# Patient Record
Sex: Male | Born: 1975 | Race: White | Hispanic: No | Smoking: Current every day smoker
Health system: Southern US, Community
[De-identification: ages and names within clinical notes are randomized; demographics above are authoritative.]

## PROBLEM LIST (undated history)

## (undated) DIAGNOSIS — R112 Nausea with vomiting, unspecified: Secondary | ICD-10-CM

## (undated) DIAGNOSIS — K219 Gastro-esophageal reflux disease without esophagitis: Secondary | ICD-10-CM

## (undated) DIAGNOSIS — F419 Anxiety disorder, unspecified: Secondary | ICD-10-CM

## (undated) DIAGNOSIS — I639 Cerebral infarction, unspecified: Secondary | ICD-10-CM

## (undated) DIAGNOSIS — R0989 Other specified symptoms and signs involving the circulatory and respiratory systems: Secondary | ICD-10-CM

## (undated) DIAGNOSIS — M1990 Unspecified osteoarthritis, unspecified site: Secondary | ICD-10-CM

## (undated) DIAGNOSIS — R519 Headache, unspecified: Secondary | ICD-10-CM

## (undated) DIAGNOSIS — I499 Cardiac arrhythmia, unspecified: Secondary | ICD-10-CM

## (undated) DIAGNOSIS — H3320 Serous retinal detachment, unspecified eye: Secondary | ICD-10-CM

## (undated) DIAGNOSIS — G4730 Sleep apnea, unspecified: Secondary | ICD-10-CM

## (undated) DIAGNOSIS — I10 Essential (primary) hypertension: Secondary | ICD-10-CM

## (undated) DIAGNOSIS — Z9889 Other specified postprocedural states: Secondary | ICD-10-CM

## (undated) DIAGNOSIS — R51 Headache: Secondary | ICD-10-CM

## (undated) HISTORY — PX: VASECTOMY: SHX75

## (undated) HISTORY — PX: MOUTH SURGERY: SHX715

## (undated) HISTORY — PX: KNEE SURGERY: SHX244

## (undated) HISTORY — PX: CARPAL TUNNEL RELEASE: SHX101

---

## 1999-04-11 ENCOUNTER — Emergency Department (HOSPITAL_COMMUNITY): Admission: EM | Admit: 1999-04-11 | Discharge: 1999-04-11 | Payer: Self-pay | Admitting: Emergency Medicine

## 2005-11-01 ENCOUNTER — Emergency Department (HOSPITAL_COMMUNITY): Admission: EM | Admit: 2005-11-01 | Discharge: 2005-11-01 | Payer: Self-pay | Admitting: Emergency Medicine

## 2007-05-28 ENCOUNTER — Ambulatory Visit (HOSPITAL_COMMUNITY): Admission: RE | Admit: 2007-05-28 | Discharge: 2007-05-28 | Payer: Self-pay | Admitting: Orthopedic Surgery

## 2008-07-18 ENCOUNTER — Inpatient Hospital Stay (HOSPITAL_COMMUNITY): Admission: EM | Admit: 2008-07-18 | Discharge: 2008-07-20 | Payer: Self-pay | Admitting: Emergency Medicine

## 2008-07-28 DIAGNOSIS — F17200 Nicotine dependence, unspecified, uncomplicated: Secondary | ICD-10-CM | POA: Insufficient documentation

## 2008-07-28 DIAGNOSIS — R079 Chest pain, unspecified: Secondary | ICD-10-CM | POA: Insufficient documentation

## 2008-07-28 DIAGNOSIS — R42 Dizziness and giddiness: Secondary | ICD-10-CM | POA: Insufficient documentation

## 2008-07-28 DIAGNOSIS — I10 Essential (primary) hypertension: Secondary | ICD-10-CM | POA: Insufficient documentation

## 2008-07-29 ENCOUNTER — Ambulatory Visit: Payer: Self-pay | Admitting: Cardiology

## 2008-07-29 DIAGNOSIS — R55 Syncope and collapse: Secondary | ICD-10-CM | POA: Insufficient documentation

## 2008-08-03 ENCOUNTER — Telehealth (INDEPENDENT_AMBULATORY_CARE_PROVIDER_SITE_OTHER): Payer: Self-pay | Admitting: *Deleted

## 2008-08-04 ENCOUNTER — Ambulatory Visit: Payer: Self-pay | Admitting: Cardiology

## 2008-08-04 ENCOUNTER — Encounter: Payer: Self-pay | Admitting: Cardiology

## 2008-08-04 ENCOUNTER — Ambulatory Visit: Payer: Self-pay

## 2008-08-18 LAB — CONVERTED CEMR LAB
Cholesterol: 174 mg/dL (ref 0–200)
LDL Cholesterol: 120 mg/dL — ABNORMAL HIGH (ref 0–99)

## 2008-08-22 ENCOUNTER — Ambulatory Visit: Payer: Self-pay

## 2008-08-22 ENCOUNTER — Encounter: Payer: Self-pay | Admitting: Cardiology

## 2008-08-26 ENCOUNTER — Encounter: Payer: Self-pay | Admitting: Cardiology

## 2008-08-29 ENCOUNTER — Ambulatory Visit: Payer: Self-pay | Admitting: Cardiology

## 2009-01-10 ENCOUNTER — Telehealth (INDEPENDENT_AMBULATORY_CARE_PROVIDER_SITE_OTHER): Payer: Self-pay | Admitting: *Deleted

## 2009-05-26 ENCOUNTER — Telehealth (INDEPENDENT_AMBULATORY_CARE_PROVIDER_SITE_OTHER): Payer: Self-pay | Admitting: *Deleted

## 2009-10-06 ENCOUNTER — Encounter: Payer: Self-pay | Admitting: Cardiology

## 2009-10-06 ENCOUNTER — Emergency Department (HOSPITAL_COMMUNITY): Admission: EM | Admit: 2009-10-06 | Discharge: 2009-10-07 | Payer: Self-pay | Admitting: Emergency Medicine

## 2009-10-11 ENCOUNTER — Telehealth: Payer: Self-pay | Admitting: Cardiology

## 2009-10-18 ENCOUNTER — Ambulatory Visit: Payer: Self-pay | Admitting: Cardiology

## 2009-11-24 ENCOUNTER — Ambulatory Visit: Payer: Self-pay

## 2009-11-24 ENCOUNTER — Ambulatory Visit: Payer: Self-pay | Admitting: Cardiology

## 2009-12-31 ENCOUNTER — Emergency Department (HOSPITAL_BASED_OUTPATIENT_CLINIC_OR_DEPARTMENT_OTHER): Admission: EM | Admit: 2009-12-31 | Discharge: 2009-12-31 | Payer: Self-pay | Admitting: Emergency Medicine

## 2009-12-31 ENCOUNTER — Ambulatory Visit: Payer: Self-pay | Admitting: Diagnostic Radiology

## 2010-04-05 NOTE — Progress Notes (Signed)
  Recieved Request for Records from [facility] forwarded to [facility] for processing [facility]  May 26, 2009 8:31 AM    Appended Document:  2nd request recieved from [facility] forwarded to [facility]   Appended Document:  Recieved request from [facility] to cancel the copying of Records forwarded to [facility]   Appended Document:  Recieved request from [facility]   Appended Document:  Received  [facility] documentation from [place], [facility], company is cancelling their request for records.

## 2010-04-05 NOTE — Assessment & Plan Note (Signed)
Summary: rov per pt mother call/pt seen in ER w/ SVT/lg   Visit Type:  rov Primary Provider:  Dr. Rosezetta Schlatter, [facility]  CC:  chest pain off and on states pt....sob at times...edema/hands....  History of Present Illness: 35 yo with history of HTN and smoking returns for cardiology evaluation because of recent chest pain. He was seen 2010 because of syncopal event that after workup may have been a seizure in the setting of Chantix use.  I also did an ETT-myoview in 2010 given his episodes of chest pain and family history of premature CAD.  This showed no evidence for ischemia or infarction.  EF was read as low on myoview but visually looked normal.  Therefore, we did an echocardiogram also that confirmed normal LV systolic function and no significant valvular abnormalities.    He has had 3 episodes of recent chest pain.  Two occurred at work and lasted for a few minutes.  The third occurred while he was sitting in his truck at the [facility] drive through.  He developed chest tightness, nausea, and lightheadedness.  He felt profoundly weak.  He did not pass out.  He went to the ER for evaluation where CXR and cardiac enzymes were normal as well as ECG.  He was sent home.  The pain was similar in character to the past, but the [facility] episode was more severe.  He has had no further syncopal episodes since 5/10.    ECG: NSR, normal  Labs (8/11): creatinine 0.86, CEs normal, HCT 39.9    Current Medications (verified): 1)  Metoprolol Tartrate 25 Mg Tabs (Metoprolol Tartrate) .... 1/2 Tab Two Times A Day 2)  Adult Aspirin Ec Low Strength 81 Mg Tbec (Aspirin) .... Once Daily  Allergies (verified): No Known Drug Allergies  Past History:  Past Medical History: 1.  Hypertension 2.  Syncopal episode 5/10: Possible seizure 3.  Smoker  4.  Echocardiogram (6/10): EF 55-60%, no significant valvular abnormalities.  5.  ETT-myoview (6/10): pt exercised 10'15".  He had some chest tightness.   EF 48%. Normal wall motion. No ischemia or scar.  6.  Holter (6/10): one run of 5 beats SVT, otherwise no significant findings 7.  Smoker  Family History: Reviewed history from 07/29/2008 and no changes required. Father: HTN.  Mother: "irregular heart beat".  Grandmother: CHF, died 57.  Both grandfathers with MIs around the age of 83.   Social History: Reviewed history from 07/29/2008 and no changes required. Full Time-mechanic Married  Tobacco Use - Yes. 1.5 ppd prior, now down to 0.5 ppd.   Review of Systems       All systems reviewed and negative except as per HPI.   Vital Signs:  Patient profile:   35 year old male Height:      70 inches Weight:      220 pounds BMI:     31.68 Pulse rate:   78 / minute Pulse rhythm:   regular BP sitting:   112 / 80  (left arm) Cuff size:   large  Vitals Entered By: Danielle Rankin, CMA (October 18, 2009 3:11 PM)  Physical Exam  General:  Well developed, well nourished, in no acute distress. Neck:  Neck supple, no JVD. No masses, thyromegaly or abnormal cervical nodes. Lungs:  Clear bilaterally to auscultation and percussion. Heart:  Non-displaced PMI, chest non-tender; regular rate and rhythm, S1, S2 without murmurs, rubs or gallops. Carotid upstroke normal, no bruit. Pedals normal pulses. No edema, no varicosities. Abdomen:  Bowel sounds positive; abdomen soft and non-tender without masses, organomegaly, or hernias noted. No hepatosplenomegaly. Extremities:  No clubbing or cyanosis. Neurologic:  Alert and oriented x 3. Psych:  Normal affect.   Impression & Recommendations:  Problem # 1:  CHEST PAIN (ICD-786.50) Patient has had several recent episodes of severe atypical CP.  ECG and workup in the ER were unremarkable.  Risk factors are smoking and family history of CAD.  He carries a diagnosis of HTN but BP is ok off meds.  I will evaluate him with an ETT.   Problem # 2:  TOBACCO ABUSE (ICD-305.1) I advised him strongly to quit.    Other Orders: Treadmill (Treadmill)  Patient Instructions: 1)  Your physician has requested that you have an exercise tolerance test.  For further information please visit [email or web address]  Please also follow instruction sheet, as given.

## 2010-04-05 NOTE — Letter (Signed)
Summary: [facility] Physician Documentation Sheet   [facility] Physician Documentation Sheet   Imported By: Roderic Ovens 11/02/2009 14:39:54  _____________________________________________________________________  External Attachment:    Type:   Image     Comment:   External Document

## 2010-04-05 NOTE — Progress Notes (Signed)
Summary: chest pain,sob,numbness...x 2 days [facility])  Phone Note Call from Patient   Caller: Mom Reason for Call: Talk to Nurse Summary of Call: pt's mom called to say pt has been coming home from work last couple days complaining of chest pain, also has sob,numbness hands/arms,nausea,breaks out in a sweat-has appt 8-17 for hospital fu for the same symptoms pls advise [phone] Initial call taken by: Glynda Jaeger,  October 11, 2009 11:38 AM  Follow-up for Phone Call        [facility]  Scherrie Bateman, LPN  October 11, 2009 12:14 PM [facility]. Sherri Rad, RN, BSN  October 12, 2009 9:12 AM   per pt calling back [phone] Lorne Skeens  October 12, 2009 10:30 AM   Additional Follow-up for Phone Call Additional follow up Details #1::        SPOKE WITH PT HAS APPT WITH DR [facility] SCHEDULED NEXT WEEK.INSTRUCTED TO KEEP APPT AND TO LIMIT ACTIVITY AT THIS TIME INSTRUCTED IF S/S WORSEN TO GO TO ER.VERBALIZED UNDERSTANDING. Additional Follow-up by: Scherrie Bateman, LPN,  October 12, 2009 3:58 PM    Prescriptions: METOPROLOL TARTRATE 25 MG TABS (METOPROLOL TARTRATE) 1/2 tab two times a day  #30 x 3   Entered by:   Scherrie Bateman, LPN   Authorized by:   Marca Ancona, MD   Signed by:   Scherrie Bateman, LPN on 45/40/9811   Method used:   Electronically to        [facility] [address] (retail)       [address]       [place], [place]  [address]       Ph: [phone] or [phone]       Fax: [phone]   RxID:   [id number]

## 2010-05-16 LAB — CBC
HCT: 41.8 % (ref 39.0–52.0)
Hemoglobin: 14.7 g/dL (ref 13.0–17.0)
MCH: 31.2 pg (ref 26.0–34.0)
RBC: 4.71 MIL/uL (ref 4.22–5.81)

## 2010-05-16 LAB — POCT CARDIAC MARKERS
Myoglobin, poc: 47 ng/mL (ref 12–200)
Myoglobin, poc: 51.6 ng/mL (ref 12–200)
Troponin i, poc: <0.05 ng/mL (ref 0.00–0.09)

## 2010-05-16 LAB — BASIC METABOLIC PANEL
Calcium: 9.4 mg/dL (ref 8.4–10.5)
Chloride: 109 mEq/L (ref 96–112)
Creatinine, Ser: 0.8 mg/dL (ref 0.4–1.5)
GFR calc Af Amer: >60 mL/min (ref >60)

## 2010-05-18 LAB — POCT CARDIAC MARKERS
CKMB, poc: 2.1 ng/mL (ref 1.0–8.0)
CKMB, poc: 4.8 ng/mL (ref 1.0–8.0)
Myoglobin, poc: 121 ng/mL (ref 12–200)
Myoglobin, poc: 147 ng/mL (ref 12–200)

## 2010-05-18 LAB — CBC
MCH: 29.5 pg (ref 26.0–34.0)
MCHC: 34.6 g/dL (ref 30.0–36.0)
Platelets: 264 10*3/uL (ref 150–400)

## 2010-05-18 LAB — DIFFERENTIAL
Basophils Relative: 0 % (ref 0–1)
Eosinophils Absolute: 0.4 10*3/uL (ref 0.0–0.7)
Neutrophils Relative %: 72 % (ref 43–77)

## 2010-05-18 LAB — BASIC METABOLIC PANEL
CO2: 25 mEq/L (ref 19–32)
Calcium: 9.3 mg/dL (ref 8.4–10.5)
Creatinine, Ser: 0.88 mg/dL (ref 0.4–1.5)
Glucose, Bld: 99 mg/dL (ref 70–99)

## 2010-05-18 LAB — CK TOTAL AND CKMB (NOT AT ARMC): CK, MB: 6.5 ng/mL (ref 0.3–4.0)

## 2010-05-18 LAB — PROTIME-INR
INR: 0.93 (ref 0.00–1.49)
Prothrombin Time: 12.7 seconds (ref 11.6–15.2)

## 2010-06-12 LAB — BASIC METABOLIC PANEL
CO2: 30 mEq/L (ref 19–32)
GFR calc non Af Amer: >60 mL/min (ref >60)
Glucose, Bld: 110 mg/dL — ABNORMAL HIGH (ref 70–99)
Potassium: 4.3 mEq/L (ref 3.5–5.1)
Sodium: 138 mEq/L (ref 135–145)

## 2010-06-12 LAB — URINALYSIS, ROUTINE W REFLEX MICROSCOPIC
Glucose, UA: NEGATIVE mg/dL
Nitrite: NEGATIVE
Protein, ur: NEGATIVE mg/dL
Urobilinogen, UA: 0.2 mg/dL (ref 0.0–1.0)

## 2010-06-12 LAB — CBC
HCT: 39.5 % (ref 39.0–52.0)
HCT: 42.6 % (ref 39.0–52.0)
Hemoglobin: 14.1 g/dL (ref 13.0–17.0)
Hemoglobin: 14.2 g/dL (ref 13.0–17.0)
Hemoglobin: 14.4 g/dL (ref 13.0–17.0)
MCHC: 35.8 g/dL (ref 30.0–36.0)
MCV: 89.7 fL (ref 78.0–100.0)
Platelets: 300 10*3/uL (ref 150–400)
RBC: 4.52 MIL/uL (ref 4.22–5.81)
RBC: 4.75 MIL/uL (ref 4.22–5.81)
RDW: 12.4 % (ref 11.5–15.5)
RDW: 13 % (ref 11.5–15.5)
WBC: 13.8 10*3/uL — ABNORMAL HIGH (ref 4.0–10.5)

## 2010-06-12 LAB — TROPONIN I
Troponin I: 0.01 ng/mL (ref 0.00–0.06)
Troponin I: 0.01 ng/mL (ref 0.00–0.06)
Troponin I: 0.02 ng/mL (ref 0.00–0.06)

## 2010-06-12 LAB — COMPREHENSIVE METABOLIC PANEL
ALT: 31 U/L (ref 0–53)
Albumin: 3.9 g/dL (ref 3.5–5.2)
Alkaline Phosphatase: 43 U/L (ref 39–117)
GFR calc Af Amer: >60 mL/min (ref >60)
Potassium: 3.6 mEq/L (ref 3.5–5.1)
Sodium: 137 mEq/L (ref 135–145)
Total Protein: 6.4 g/dL (ref 6.0–8.3)

## 2010-06-12 LAB — POCT I-STAT, CHEM 8
BUN: 13 mg/dL (ref 6–23)
Calcium, Ion: 1.14 mmol/L (ref 1.12–1.32)
Creatinine, Ser: 1 mg/dL (ref 0.4–1.5)
Glucose, Bld: 104 mg/dL — ABNORMAL HIGH (ref 70–99)
Sodium: 137 mEq/L (ref 135–145)
TCO2: 23 mmol/L (ref 0–100)

## 2010-06-12 LAB — LIPID PANEL
Total CHOL/HDL Ratio: 5 RATIO
Triglycerides: 73 mg/dL (ref <150)
VLDL: 15 mg/dL (ref 0–40)

## 2010-06-12 LAB — CARDIAC PANEL(CRET KIN+CKTOT+MB+TROPI)
CK, MB: 5.9 ng/mL — ABNORMAL HIGH (ref 0.3–4.0)
Relative Index: 0.8 (ref 0.0–2.5)
Troponin I: 0.01 ng/mL (ref 0.00–0.06)

## 2010-06-12 LAB — CK TOTAL AND CKMB (NOT AT ARMC)
CK, MB: 3.3 ng/mL (ref 0.3–4.0)
Total CK: 345 U/L — ABNORMAL HIGH (ref 7–232)
Total CK: 720 U/L — ABNORMAL HIGH (ref 7–232)

## 2010-06-12 LAB — RAPID URINE DRUG SCREEN, HOSP PERFORMED
Barbiturates: NOT DETECTED
Opiates: NOT DETECTED

## 2010-06-12 LAB — POCT CARDIAC MARKERS
Myoglobin, poc: 465 ng/mL (ref 12–200)
Troponin i, poc: <0.05 ng/mL (ref 0.00–0.09)

## 2010-06-12 LAB — DIFFERENTIAL
Eosinophils Absolute: 0 10*3/uL (ref 0.0–0.7)
Eosinophils Relative: 0 % (ref 0–5)
Lymphs Abs: 1.3 10*3/uL (ref 0.7–4.0)
Monocytes Absolute: 0.7 10*3/uL (ref 0.1–1.0)
Monocytes Relative: 5 % (ref 3–12)

## 2010-07-17 NOTE — Procedures (Signed)
TECHNICIAN:  Georgiann Mccoy.   PHYSICIAN:  Dr. Ardyth Harps.   This is a portable EEG study and neither hyperventilation nor photic  stimulation were performed in the 16-channel EEG recording with one  channel representing heart rate and rhythm exclusively.  The patient was  admitted on Jul 18, 2008, for chest pain and possible seizure.  The  patient suffered the chest pain at work, became dizzy and collapsed,  witnessed were several convulsions.  The question now is if this is a  epileptiform seizure or a convulsive syncope.  He was brought to [facility] ED by EMS.  The patient had just started taking Chantix for smoking  cessation.  Current medications besides Chantix are NicoDerm, Zofran,  Tylenol, nitroglycerin, aspirin, and heparin.   This is an report on a 16-channel EEG recording with one channel  representing heart rate and rhythm exclusively.  The patient remained  cooperative and quiet during this exam.  A posterior dominant background  rhythm was easily established and is well developed seen over both  posterior hemispheres with a rhythm of 10 Hz.  EKG shows 64-68 beats per  minute.  There is great regularity on the rhythm strip.  Again, no  hyperventilation was performed neither was photic stimulation.  The  study was technically very good.  There was minimal motion artifact  noted.  Brain activity remained symmetric without focal slowing or  epileptiform discharges.      Melvyn Novas, M.D.  Electronically Signed     [id number]  D:  07/20/2008 17:54:45  T:  07/21/2008 07:14:19  Job #:  [id number]

## 2010-07-17 NOTE — H&P (Signed)
NAMEFABIANO, Gary Patel NO.:  [id number]   MEDICAL RECORD NO.:  [id number]          PATIENT TYPE:  EMS   LOCATION:  ED                           FACILITY:  [facility]   PHYSICIAN:  Joylene John, MD       DATE OF BIRTH:  Sep 22, 1975   DATE OF ADMISSION:  07/18/2008  DATE OF DISCHARGE:                              HISTORY & PHYSICAL   REASON FOR ADMISSION:  Chest pain.   HISTORY OF PRESENT ILLNESS:  This is a 35 year old Caucasian male who is  pre-lethargic from having received benzodiazepines by EMS in the ER  since the history is limited.  Wife is beside the patient, however, she  was not present at the incident, so is not able to contribute much.  Most of history is obtained per ED chart.  Per the chart, the patient  was at work and started having chest pain.  The patient has no  recollection of events after the chest pain started.  Per the chart, the  patient fell to the ground, had a seizure-like activity.  EMS was called  who gave him Versed and transported him to the ER.  The patient was  given further at Ativan in the ER as well and then admitted to the  inpatient service for further care.  According to the patient, he tells  me that he had been having similar kind of chest pain for the last one  year 5-6 episodes in total so far, no particular time of the day and no  particular triggered events.  According to the patient, most of the time  when the pain comes on he is working. When the episodes come, they last  for a while.  The patient is unable to tell me the exact time frame.  The patient describes the pain as being sharp and pressure-like in  nature, located in the middle of the chest.  The patient tells me it  feels as if my heart is going to come out.  Associated with dizziness  and lightheadedness.  No nausea, vomiting or diaphoresis.  The patient  does have a primary care physician; however, he has not had any physical  in awhile.   ALLERGIES:  No known  allergies.   MEDICATIONS:  1. Blood pressure medication, however, the patient cannot recollect      the name.  2. Chantix according to wife for last 2 weeks has messed him up.   FAMILY HISTORY:  No family history seizures.   SOCIAL HISTORY:  The patient is a smoker, Marine scientist, works as a  Curator.  The patient is unable to give me quantity of cigarettes or  the time duration.  The patient's wife is not sure how long he has been  smoking.  No alcohol or drugs.   PHYSICAL EXAMINATION:  VITAL SIGNS: Temperature is 98.3, blood pressure  initially when he came in was 147/90, repeat blood pressure 97/48, pulse  was originally 128, now is 92, respirations 24 originally now is 12.  GENERAL:  The patient is lethargic, however, is arousable, white  Caucasian male  in no acute distress.  No icterus, pallor or JVD  appreciated.  HEART:  The patient is tachycardiac.  LUNGS:  Clear to auscultation.  Belly is soft.  Bowel sounds present.  LOWER EXTREMITIES:  No edema appreciated.  The patient is unable to  cooperate with the full neurological exam given his lethargy; however,  neurological exam grossly is normal.   STUDIES:  EKG is sinus tachycardia.  No previous EKG available for exam.   LABORATORY DATA:  White count of 13.8, hemoglobin 14.4, hematocrit 42.6,  platelets 310.  Sodium 157, potassium 3.5, chloride 102, bicarb 22, BUN  13, creatinine 1, glucose 104, cardiac enzymes first set is negative.  UA is clear.  U-tox is negative except for positive benzodiazepines.  D-  dimer is within normal limits.  Chest X-Ray:  No acute pathology noted.  CT of the head:  No acute pathology noted. Ethmoid sinusitis.   ASSESSMENT:  This is a 35 year old male coming with chest pain and  seizure-like activity.   PLAN:  Plan is to admit the patient for 24-hour observation to rule him  out with cardiac enzymes x3 and put him on tele.  We will hold his blood  pressure medications given his low blood  pressure currently which is  likely a side effect of benzodiazepines, and also to hold his Chantix.  The patient will likely need outpatient follow-up with his primary care  upon discharge.  If the patient has further seizures during this  hospitalization, then Neurology consult will be required.      Joylene John, MD  Electronically Signed     RP/MEDQ  D:  07/18/2008  T:  07/18/2008  Job:  [id number]

## 2010-07-17 NOTE — Discharge Summary (Signed)
Gary Patel, Gary Patel NO.:  [id number]   MEDICAL RECORD NO.:  [id number]          PATIENT TYPE:  INP   LOCATION:  1413                         FACILITY:  [facility]   PHYSICIAN:  Eduard Clos, MDDATE OF BIRTH:  1975/04/05   DATE OF ADMISSION:  07/18/2008  DATE OF DISCHARGE:  07/20/2008                               DISCHARGE SUMMARY   HOSPITAL COURSE:  A 35 year old male with a known history of  hypertension presented with complaints of chest pain.  Initially, the  patient developed some generalized tonic-clonic seizure in his  workplace.  Was brought into the ER.  In the ER, the patient was given  Ativan and subsequently the patient started complaining of chest pain.  The patient admitted to the telemetry floor.  Serial enzymes were done,  which were negative.  EKG showed normal sinus rhythm with no acute ST-T  wave changes.  The patient had a CT of the head done on admission, which  did not show any acute findings.  X-rays also showing no acute chest  disease.  At this time, the patient's symptoms have resolved.  No  further seizures during the stay.  As the patient is asymptomatic at  this time, the patient will be discharged home with advised no driving  until seen by neurologist.  I have provided the phone number to contact  [facility].  The patient's wife is well aware of the office and  will be contacting and make an appointment.  Also made a followup  appointment with [facility] on May 28.  Strongly advised to keep  this appointment, as the patient will need further workup on his chest  pain.  At the time of this dictation, the patient is hemodynamically  stable.   PROCEDURES DONE DURING THE STAY:  1. On Jul 18, 2008 chest x-ray showed no acute disease.  2. CT of the head without contrast on Jul 18, 2008 showed nothing      acute.  3. EEG results are still pending.   PERTINENT LABS:  Cardiac enzymes were negative.   FINAL  DIAGNOSES:  1. Chest pain, right upper lobe, ruled out.  2. Possible seizure.  3. Hypertension.  4. Tobacco abuse.   MEDICATIONS AT DISCHARGE:  1. Aspirin 81 mg p.o. daily.  2. Metoprolol 25 mg p.o. b.i.d.   PLAN:  The patient to follow up with [facility] on Jul 29, 2008  at 4 p.m.  To call [facility] at [phone] to make  appointment and follow up his EEG results and follow up on possible  seizure.  I advised the patient to not drive until cleared by  neurologist.  The patient is to eat a cardiac-healthy diet.  To follow  up with his primary care physician within a week's time.      Eduard Clos, MD  Electronically Signed     ANK/MEDQ  D:  07/20/2008  T:  07/20/2008  Job:  [id number]   cc:   [facility]  Fax: [phone]

## 2011-07-30 ENCOUNTER — Emergency Department (HOSPITAL_BASED_OUTPATIENT_CLINIC_OR_DEPARTMENT_OTHER): Payer: BC Managed Care – PPO

## 2011-07-30 ENCOUNTER — Encounter (HOSPITAL_BASED_OUTPATIENT_CLINIC_OR_DEPARTMENT_OTHER): Payer: Self-pay | Admitting: *Deleted

## 2011-07-30 ENCOUNTER — Emergency Department (HOSPITAL_BASED_OUTPATIENT_CLINIC_OR_DEPARTMENT_OTHER)
Admission: EM | Admit: 2011-07-30 | Discharge: 2011-07-31 | Disposition: A | Discharge status: Home / Self Care | Payer: BC Managed Care – PPO | Attending: Emergency Medicine | Admitting: Emergency Medicine

## 2011-07-30 DIAGNOSIS — F411 Generalized anxiety disorder: Secondary | ICD-10-CM | POA: Insufficient documentation

## 2011-07-30 DIAGNOSIS — I10 Essential (primary) hypertension: Secondary | ICD-10-CM | POA: Insufficient documentation

## 2011-07-30 DIAGNOSIS — E669 Obesity, unspecified: Secondary | ICD-10-CM | POA: Insufficient documentation

## 2011-07-30 DIAGNOSIS — M79609 Pain in unspecified limb: Secondary | ICD-10-CM | POA: Insufficient documentation

## 2011-07-30 DIAGNOSIS — F17200 Nicotine dependence, unspecified, uncomplicated: Secondary | ICD-10-CM | POA: Insufficient documentation

## 2011-07-30 DIAGNOSIS — F419 Anxiety disorder, unspecified: Secondary | ICD-10-CM

## 2011-07-30 HISTORY — DX: Essential (primary) hypertension: I10

## 2011-07-30 HISTORY — DX: Anxiety disorder, unspecified: F41.9

## 2011-07-30 NOTE — ED Notes (Signed)
MD at bedside. 

## 2011-07-30 NOTE — ED Provider Notes (Signed)
History     CSN: [id number]  Arrival date & time 07/30/11  2001   First MD Initiated Contact with Patient 07/30/11 2303      Chief Complaint  Patient presents with  . Anxiety    (Consider location/radiation/quality/duration/timing/severity/associated sxs/prior treatment) HPI Complains of anxiety for several months he's anxious over his son's poor health and financial issues. He reports he became more anxious tonight. He treated himself with Klonopin tablet his anxiety is now under control. He reports that he's been out of sertraline for several months and feels that sertraline is not doing well to treat his anxiety.. patient also reports left heel pain for several months worse with walking and weightbearing he's tried changing shoes, without relief he's tried ibuprofen, without relief. No other complaint Past Medical History  Diagnosis Date  . Anxiety   . Hypertension     History reviewed. No pertinent past surgical history.  No family history on file.  History  Substance Use Topics  . Smoking status: Current Everyday Smoker -- 1.0 packs/day  . Smokeless tobacco: Not on file  . Alcohol Use: No      Review of Systems  Constitutional: Negative.   HENT: Negative.   Respiratory: Negative.   Cardiovascular: Negative.   Gastrointestinal: Negative.   Musculoskeletal: Negative.        Left foot pain  Skin: Negative.   Neurological: Negative.   Hematological: Negative.   Psychiatric/Behavioral: The patient is nervous/anxious.   All other systems reviewed and are negative.    Allergies  Strawberry  Home Medications   Current Outpatient Rx  Name Route Sig Dispense Refill  . GOODY HEADACHE PO Oral Take 1 packet by mouth once as needed. For headache    . CLONAZEPAM 0.5 MG PO TABS Oral Take 0.5 mg by mouth 2 (two) times daily as needed. For anxiety    . METOPROLOL SUCCINATE ER 25 MG PO TB24 Oral Take 25 mg by mouth daily.    . SERTRALINE HCL 50 MG PO TABS Oral Take 50  mg by mouth 2 (two) times daily.      BP 101/53  Pulse 74  Temp(Src) 97.8 F (36.6 C) (Oral)  Resp 19  SpO2 96%  Physical Exam  Nursing note and vitals reviewed. Constitutional: He appears well-developed and well-nourished.  HENT:  Head: Normocephalic and atraumatic.  Eyes: Conjunctivae are normal. Pupils are equal, round, and reactive to light.  Neck: Neck supple. No tracheal deviation present. No thyromegaly present.  Cardiovascular: Normal rate and regular rhythm.   No murmur heard. Pulmonary/Chest: Effort normal and breath sounds normal.  Abdominal: He exhibits no distension. There is no tenderness.       Obese  Musculoskeletal: Normal range of motion. He exhibits no edema and no tenderness.       Left lower extremity no redness no warmth no tenderness DP pulse 2+ good capillary refill all other extremities atraumatic, neurovascularly intact  Neurological: He is alert. Coordination normal.       Walks with minimal limp favoring left lower extremity  Skin: Skin is warm and dry. No rash noted.  Psychiatric: He has a normal mood and affect.    ED Course  Procedures (including critical care time)  Labs Reviewed - No data to display Dg Foot Complete Left  07/30/2011  *RADIOLOGY REPORT*  Clinical Data: Left foot and heel pain for 1.5 months  LEFT FOOT - COMPLETE 3+ VIEW  Comparison:  None.  Findings:  There is no evidence of  fracture or dislocation.  There is no evidence of arthropathy or other focal bone abnormality. Soft tissues are unremarkable.  IMPRESSION: Negative.  Original Report Authenticated By: Elsie Stain, M.D.     No diagnosis found.  Results for orders placed during the hospital encounter of 12/31/09  CBC      Component Value Range   WBC 8.0  4.0 - 10.5 (K/uL)   RBC 4.71  4.22 - 5.81 (MIL/uL)   Hemoglobin 14.7  13.0 - 17.0 (g/dL)   HCT 16.1  09.6 - 04.5 (%)   MCV 88.6  78.0 - 100.0 (fL)   MCH 31.2  26.0 - 34.0 (pg)   MCHC 35.2  30.0 - 36.0 (g/dL)    RDW 40.9  81.1 - 91.4 (%)   Platelets 302  150 - 400 (K/uL)  POCT CARDIAC MARKERS      Component Value Range   Myoglobin, poc 51.6  12 - 200 (ng/mL)   CKMB, poc 1.9  1.0 - 8.0 (ng/mL)   Troponin i, poc <0.05  0.00 - 0.09 (ng/mL)   Comment       Value:            TROPONIN VALUES IN THE RANGE     OF 0.00-0.09 ng/mL SHOW     NO INDICATION OF     MYOCARDIAL INJURY.                PERSISTENTLY INCREASED TROPONIN     VALUES IN THE RANGE OF 0.10-0.24     ng/mL CAN BE SEEN IN:           -UNSTABLE ANGINA           -CONGESTIVE HEART FAILURE           -MYOCARDITIS           -CHEST TRAUMA           -ARRYHTHMIAS           -LATE PRESENTING MI           -COPD       CLINICAL FOLLOW-UP RECOMMENDED.                TROPONIN VALUES >=0.25 ng/mL     INDICATE POSSIBLE MYOCARDIAL     ISCHEMIA. SERIAL TESTING     RECOMMENDED.  BASIC METABOLIC PANEL      Component Value Range   Sodium 144  135 - 145 (mEq/L)   Potassium 3.8  3.5 - 5.1 (mEq/L)   Chloride 109  96 - 112 (mEq/L)   CO2 25  19 - 32 (mEq/L)   Glucose, Bld 130 (*) 70 - 99 (mg/dL)   BUN 8  6 - 23 (mg/dL)   Creatinine, Ser .8  0.4 - 1.5 (mg/dL)   Calcium 9.4  8.4 - 78.2 (mg/dL)   GFR calc non Af Amer >60  >60 (mL/min)   GFR calc Af Amer    >60 (mL/min)   Value: >60            The eGFR has been calculated     using the MDRD equation.     This calculation has not been     validated in all clinical     situations.     eGFR's persistently     <60 mL/min signify     possible Chronic Kidney Disease.  POCT CARDIAC MARKERS      Component Value Range   Myoglobin, poc 47.0  12 - 200 (ng/mL)  CKMB, poc 1.7  1.0 - 8.0 (ng/mL)   Troponin i, poc <0.05  0.00 - 0.09 (ng/mL)   Comment       Value:            TROPONIN VALUES IN THE RANGE     OF 0.00-0.09 ng/mL SHOW     NO INDICATION OF     MYOCARDIAL INJURY.                PERSISTENTLY INCREASED TROPONIN     VALUES IN THE RANGE OF 0.10-0.24     ng/mL CAN BE SEEN IN:           -UNSTABLE  ANGINA           -CONGESTIVE HEART FAILURE           -MYOCARDITIS           -CHEST TRAUMA           -ARRYHTHMIAS           -LATE PRESENTING MI           -COPD       CLINICAL FOLLOW-UP RECOMMENDED.                TROPONIN VALUES >=0.25 ng/mL     INDICATE POSSIBLE MYOCARDIAL     ISCHEMIA. SERIAL TESTING     RECOMMENDED.   Dg Foot Complete Left  07/30/2011  *RADIOLOGY REPORT*  Clinical Data: Left foot and heel pain for 1.5 months  LEFT FOOT - COMPLETE 3+ VIEW  Comparison:  None.  Findings:  There is no evidence of fracture or dislocation.  There is no evidence of arthropathy or other focal bone abnormality. Soft tissues are unremarkable.  IMPRESSION: Negative.  Original Report Authenticated By: Elsie Stain, M.D.   X-ray reviewed by me  MDM  Patient's anxiety is chronic. I instructed him to followup with his primary care physician tomorrow for counseling and advised him that sertraline does not work.I als suggested store bought orthotic for [employer] #1 chronic anxiety #2 chronic foot pain        Doug Sou, MD 07/30/11 2322

## 2011-07-30 NOTE — Discharge Instructions (Signed)
Anxiety and Panic Attacks  Call Dr. Doristine Counter tomorrow to advise him about your anxiety and tell him that sertraline does not work well for you and that you've not been taking it. He may need other medication or further counseling.But an orthotic for your shoe at the [employer] or [facility].  If that does not help after a few weeks , you may need to see a foot doctor Anxiety is your body's way of reacting to real danger or something you think is a danger. It may be fear or worry over a situation like losing your job. Sometimes the cause is not known. A panic attack is made up of physical signs like sweating, shaking, or chest pain. Anxiety and panic attacks may start suddenly. They may be strong. They may come at any time of day, even while sleeping. They may come at any time of life. Panic attacks are scary, but they do not harm you physically.  HOME CARE  Avoid any known causes of your anxiety.   Try to relax. Yoga may help. Tell yourself everything will be okay.   Exercise often.   Get expert advice and help (therapy) to stop anxiety or attacks from happening.   Avoid caffeine, alcohol, and drugs.   Only take medicine as told by your doctor.  GET HELP RIGHT AWAY IF:  Your attacks seem different than normal attacks.   Your problems are getting worse or concern you.  MAKE SURE YOU:  Understand these instructions.   Will watch your condition.   Will get help right away if you are not doing well or get worse.  Document Released: 03/23/2010 Document Revised: 02/07/2011 Document Reviewed: 03/23/2010 [facility] Patient Information 2012 [place], [place].

## 2011-07-30 NOTE — ED Notes (Addendum)
Ran out of anxiety medication a month ago. Had a crying fit tonight per wife. Seems calm and cooperative on arrival to triage. While he is here he wants to have his left foot checked out. Pain in his heel after he works.

## 2013-06-29 ENCOUNTER — Other Ambulatory Visit: Payer: Self-pay

## 2013-06-29 ENCOUNTER — Emergency Department (HOSPITAL_COMMUNITY): Payer: Medicaid Other

## 2013-06-29 ENCOUNTER — Emergency Department (HOSPITAL_COMMUNITY)
Admission: EM | Admit: 2013-06-29 | Discharge: 2013-06-30 | Disposition: A | Discharge status: Home / Self Care | Payer: Medicaid Other | Attending: Emergency Medicine | Admitting: Emergency Medicine

## 2013-06-29 DIAGNOSIS — F17200 Nicotine dependence, unspecified, uncomplicated: Secondary | ICD-10-CM

## 2013-06-29 DIAGNOSIS — I10 Essential (primary) hypertension: Secondary | ICD-10-CM

## 2013-06-29 DIAGNOSIS — R079 Chest pain, unspecified: Secondary | ICD-10-CM

## 2013-06-29 DIAGNOSIS — Z8659 Personal history of other mental and behavioral disorders: Secondary | ICD-10-CM | POA: Insufficient documentation

## 2013-06-29 MED ORDER — NITROGLYCERIN 0.4 MG SL SUBL
0.4000 mg | SUBLINGUAL_TABLET | SUBLINGUAL | Status: DC | PRN
  Filled 2013-06-29: qty 1

## 2013-06-29 NOTE — ED Notes (Signed)
Per EMS, patient was found on floor, no fall per the family.  Pain in the chest after he got home from work around 4pm "and I didn't think nothing about it" says the patient.  He ate and laid down, and couldn't get comfortable.  Pain was unbearable by 2048, left sided chest pain, really sharp.  He couldn't stand himself.  12 lead shows T inversion, with sinus tach. Initial BP 190/126. 4 baby aspirin, 1 nitro subling, 5mg  of morphine. Pain still present, so 2mg  of morphine given.   18g in right hand.  18g in left AC.  BP 123/76. Patient is on nonrebreather.  Patient is alert and oriented.

## 2013-06-30 ENCOUNTER — Encounter (HOSPITAL_COMMUNITY): Payer: Self-pay | Admitting: Emergency Medicine

## 2013-06-30 ENCOUNTER — Emergency Department (HOSPITAL_COMMUNITY): Payer: Medicaid Other

## 2013-06-30 LAB — CBC
HEMATOCRIT: 41.9 % (ref 39.0–52.0)
HEMOGLOBIN: 14.7 g/dL (ref 13.0–17.0)
MCH: 30 pg (ref 26.0–34.0)
MCHC: 35.1 g/dL (ref 30.0–36.0)
MCV: 85.5 fL (ref 78.0–100.0)
Platelets: 299 10*3/uL (ref 150–400)
RBC: 4.9 MIL/uL (ref 4.22–5.81)
RDW: 13.1 % (ref 11.5–15.5)
WBC: 11.6 10*3/uL — ABNORMAL HIGH (ref 4.0–10.5)

## 2013-06-30 LAB — BASIC METABOLIC PANEL
BUN: 9 mg/dL (ref 6–23)
CO2: 22 mEq/L (ref 19–32)
Calcium: 9.1 mg/dL (ref 8.4–10.5)
Chloride: 102 mEq/L (ref 96–112)
Creatinine, Ser: 0.86 mg/dL (ref 0.50–1.35)
GFR calc non Af Amer: >90 mL/min (ref >90)
Glucose, Bld: 118 mg/dL — ABNORMAL HIGH (ref 70–99)
Potassium: 3.6 mEq/L — ABNORMAL LOW (ref 3.7–5.3)
Sodium: 139 mEq/L (ref 137–147)

## 2013-06-30 LAB — PRO B NATRIURETIC PEPTIDE: PRO B NATRI PEPTIDE: 32 pg/mL (ref 0–125)

## 2013-06-30 LAB — I-STAT TROPONIN, ED: TROPONIN I, POC: 0 ng/mL (ref 0.00–0.08)

## 2013-06-30 LAB — TROPONIN I

## 2013-06-30 MED ORDER — MORPHINE SULFATE 4 MG/ML IJ SOLN: 4.0000 mg | INTRAMUSCULAR | Status: DC | PRN

## 2013-06-30 NOTE — Discharge Instructions (Signed)
Take aspirin daily until you're cleared by your doctor or the cardiologist. Discuss further evaluation and possible stress test outpatient. If your chest pain returns or he become diaphoretic or pass out or worsening symptoms return to the ER.  If you were given medicines take as directed.  If you are on coumadin or contraceptives realize their levels and effectiveness is altered by many different medicines.  If you have any reaction (rash, tongues swelling, other) to the medicines stop taking and see a physician.   Please follow up as directed and return to the ER or see a physician for new or worsening symptoms.  Thank you. Filed Vitals:   06/30/13 0200 06/30/13 0245 06/30/13 0330 06/30/13 0345  BP: 130/76 123/86 123/80 120/76  Pulse: 63 62 61 58  Temp:      TempSrc:      Resp: 15 13 12 11   Height:      Weight:      SpO2: 96% 96% 95% 93%

## 2013-06-30 NOTE — ED Notes (Signed)
Notified Whitney from phlebotomy about new lab order for BNP.

## 2013-06-30 NOTE — ED Notes (Signed)
Removed both IV's placed by EMS for discharge home. Cathater intact.

## 2013-06-30 NOTE — ED Notes (Signed)
Patient returned from X-ray 

## 2013-06-30 NOTE — ED Notes (Signed)
Explained to the patient that a 2 view x-ray has been ordered also. Prepared for transport.

## 2013-06-30 NOTE — ED Provider Notes (Signed)
CSN: [id number]     Arrival date & time 06/29/13  2337 History   First MD Initiated Contact with Patient 06/30/13 0004     Chief Complaint  Patient presents with  . Chest Pain     (Consider location/radiation/quality/duration/timing/severity/associated sxs/prior Treatment) HPI Comments: 38 year old male with smoking, high blood pressure history presents with chest pain. Patient has had chest pain since coming home from work at 5:30 PM today. Chest pain has been constant since and nothing specific worsens. Pain has gradually improved without treatment. No history of cardiac or blood clots.Patient denies blood clot history, active cancer, recent major trauma or surgery, unilateral leg swelling/ pain, recent long travel, hemoptysis or oral contraceptives. Mild radiation to left shoulder. Family history of cardiac father. No shortness of breath. No exertional symptoms or diaphoresis. Patient has not taken his blood pressure medicines one year.   Patient is a 38 y.o. male presenting with chest pain. The history is provided by the patient.  Chest Pain Associated symptoms: no abdominal pain, no back pain, no fever, no headache, no shortness of breath and not vomiting     Past Medical History  Diagnosis Date  . Anxiety   . Hypertension    History reviewed. No pertinent past surgical history. History reviewed. No pertinent family history. History  Substance Use Topics  . Smoking status: Current Every Day Smoker -- 1.00 packs/day  . Smokeless tobacco: Not on file  . Alcohol Use: No    Review of Systems  Constitutional: Negative for fever and chills.  HENT: Negative for congestion.   Eyes: Negative for visual disturbance.  Respiratory: Negative for shortness of breath.   Cardiovascular: Positive for chest pain. Negative for leg swelling.  Gastrointestinal: Negative for vomiting and abdominal pain.  Genitourinary: Negative for dysuria and flank pain.  Musculoskeletal: Negative for back  pain, neck pain and neck stiffness.  Skin: Negative for rash.  Neurological: Negative for syncope, light-headedness and headaches.      Allergies  Strawberry  Home Medications   Prior to Admission medications   Not on File   BP 138/80  Pulse 73  Temp(Src) 98.3 F (36.8 C) (Oral)  Resp 15  Ht 5\' 10"  (1.778 m)  Wt 217 lb (98.431 kg)  BMI 31.14 kg/m2  SpO2 95% Physical Exam  Nursing note and vitals reviewed. Constitutional: He is oriented to person, place, and time. He appears well-developed and well-nourished.  HENT:  Head: Normocephalic and atraumatic.  Eyes: Conjunctivae are normal. Right eye exhibits no discharge. Left eye exhibits no discharge.  Neck: Normal range of motion. Neck supple. No tracheal deviation present.  Cardiovascular: Normal rate, regular rhythm and intact distal pulses.   No murmur heard. Pulmonary/Chest: Effort normal. He has rales.  Abdominal: Soft. He exhibits no distension. There is no tenderness. There is no guarding.  Musculoskeletal: He exhibits no edema and no tenderness (few crackles at bases bilateral.).  Neurological: He is alert and oriented to person, place, and time. No cranial nerve deficit.  Skin: Skin is warm. No rash noted.  Psychiatric: He has a normal mood and affect.    ED Course  Procedures (including critical care time) Labs Review Labs Reviewed  CBC - Abnormal; Notable for the following:    WBC 11.6 (*)    All other components within normal limits  BASIC METABOLIC PANEL - Abnormal; Notable for the following:    Potassium 3.6 (*)    Glucose, Bld 118 (*)    All other components within normal  limits  PRO B NATRIURETIC PEPTIDE  TROPONIN I  Rosezena SensorI-STAT TROPOININ, ED    Imaging Review Dg Chest Port 1 View  06/30/2013   CLINICAL DATA:  Chest pain  EXAM: PORTABLE CHEST - 1 VIEW  COMPARISON:  DG CHEST 2 VIEW dated 12/31/2009  FINDINGS: The lungs are borderline hypoinflated. The interstitial markings are increased bilaterally but  not greatly changed from the previous study. The cardiopericardial silhouette remains enlarged. The pulmonary vascularity is indistinct. There is no pleural effusion. The trachea is midline. The observed portions of the bony thorax appear normal.  IMPRESSION: The findings suggest low-grade CHF. The appearance of the pulmonary interstitium is accentuated by the borderline hypo inflation. When the patient can tolerate the procedure, a PA and lateral chest x-ray with deep inspiration would be of value.   Electronically Signed   By: David  SwazilandJordan   On: 06/30/2013 00:26     EKG Interpretation None     An EKG reviewed heart rate 73, sinus rhythm, normal QT normal PR, normal axis, mild ST depression inferior leads. Changes noted compared to previous reviewed. MDM   Final diagnoses:  Chest pain  TOBACCO ABUSE  Essential hypertension, benign   Patient low risk cardiac and low risk pulmonary embolism. No tearing sensation or radiation of the back. Patient is perc negative with unremarkable vital signs. No d-dimer indicated at this time. Chest x-ray reviewed and suggests possible mild CHF. Patient does have mild crackles at the bases. No history of heart failure. Patient denies weight gain or leg swelling or orthopnea. Plan for cardiac rule out. Patient's blood pressure improved with nitroglycerin.   Repeat chest x-ray showed no signs of CHF and BNP very low. On recheck patient has no symptoms and vitals normal. Patient observed in the ER 4 hours with negative delta troponin. I had a long discussion with patient and family at bedside regarding differential diagnosis and patient's risk factors for coronary artery disease. We discussed observation in the ER for further troponin stress test versus close followup with primary care provider. Patient and family prefer to see father's cardiologist to discuss stress test. Patient had aspirin today. Discussed taking daily aspirin until cleared by cardiology.  Patient given strict reasons to return especially for chest pain, shortness of breath or passing out. Patient well-appearing and asymptomatic on discharge.  Results and differential diagnosis were discussed with the patient. Close follow up outpatient was discussed, patient comfortable with the plan.   Filed Vitals:   06/30/13 0330 06/30/13 0345 06/30/13 0415 06/30/13 0445  BP: 123/80 120/76 134/100 123/85  Pulse: 61 58 76 62  Temp:      TempSrc:      Resp: 12 11 11 12   Height:      Weight:      SpO2: 95% 93% 97% 97%       Enid SkeensJoshua M Amando Chaput, MD 07/01/13 1038

## 2014-10-29 IMAGING — CR DG CHEST 2V
2 series · 2 of 2 positions shown · non-contrast
Comparison: Chest radiograph performed 06/29/2013

CLINICAL DATA: Sharp left-sided chest pain.

EXAM:
CHEST  2 VIEW

[w chest pa]
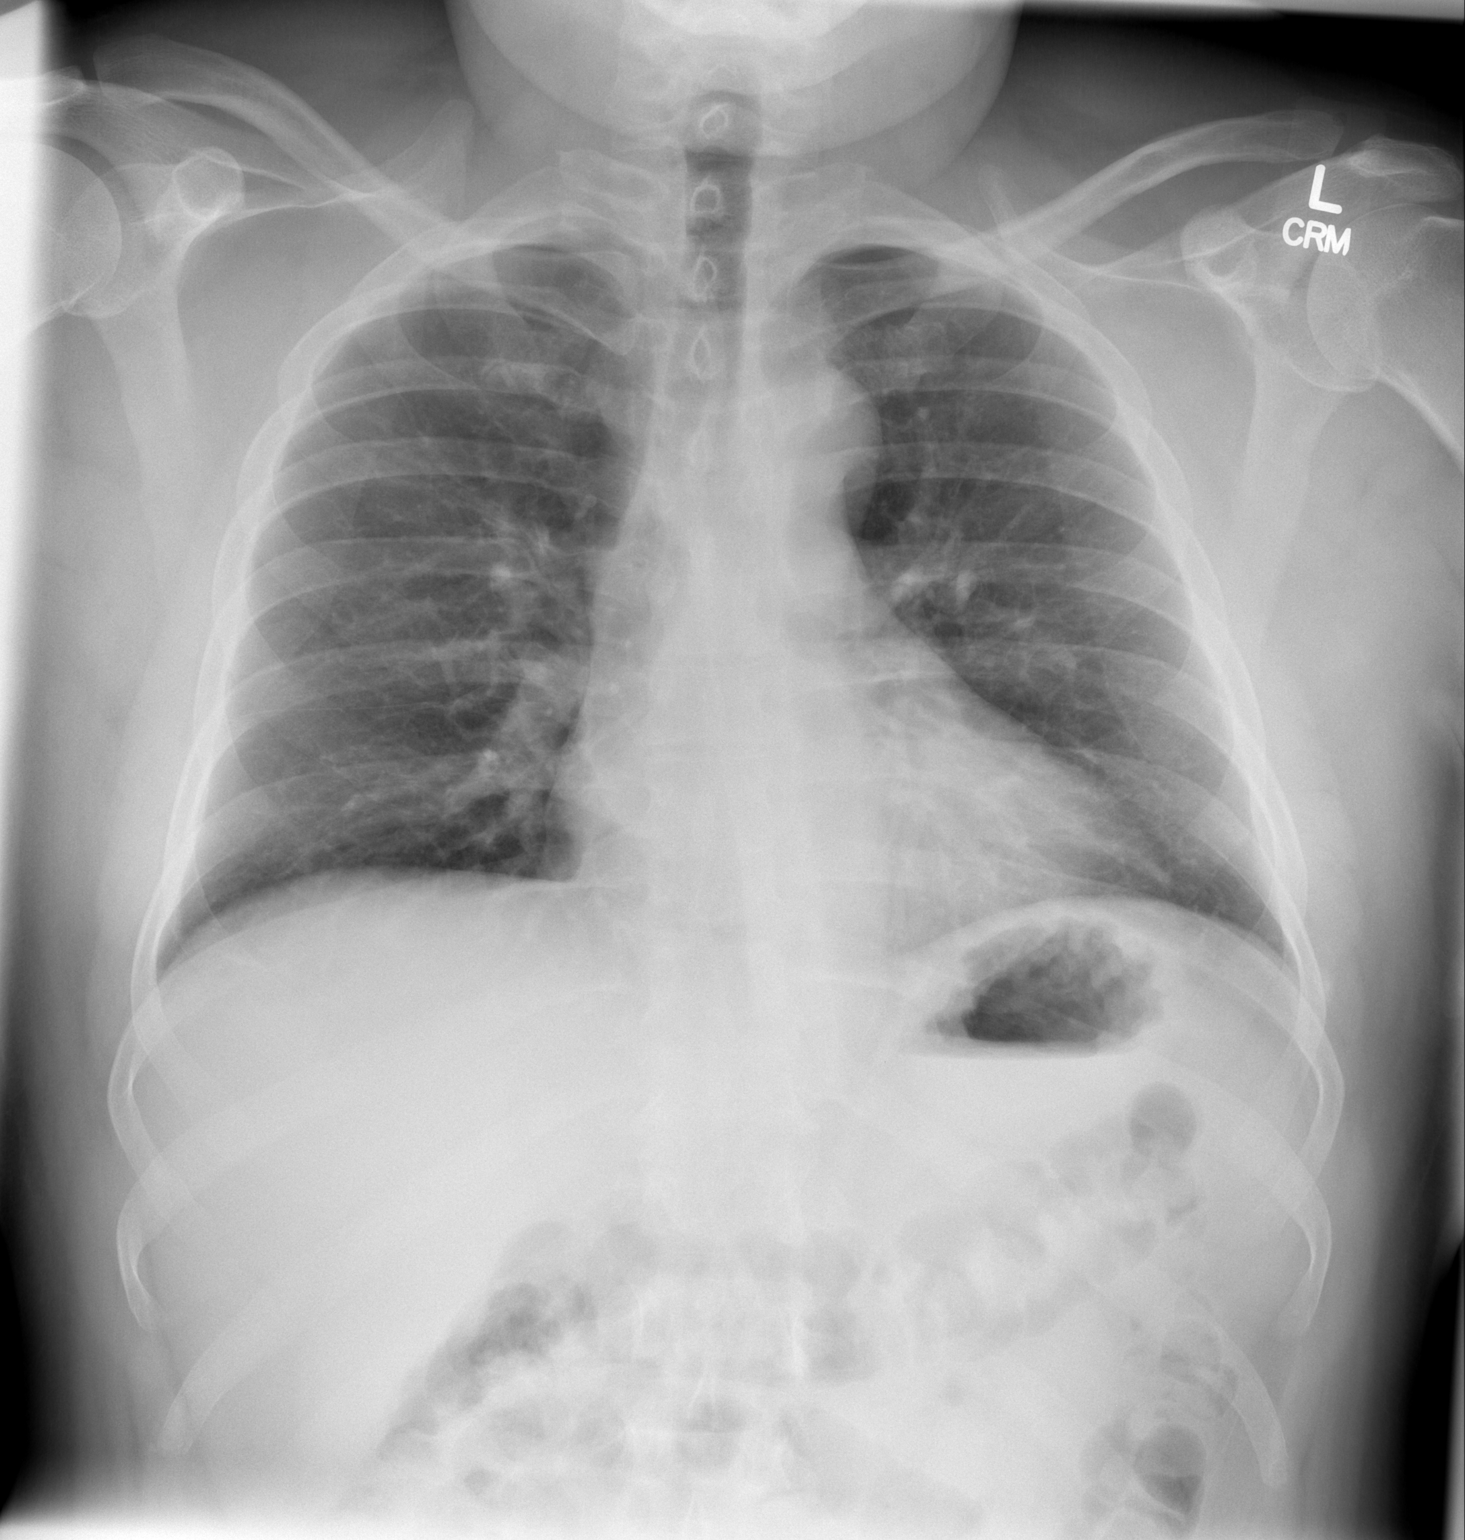

[w chest lat]
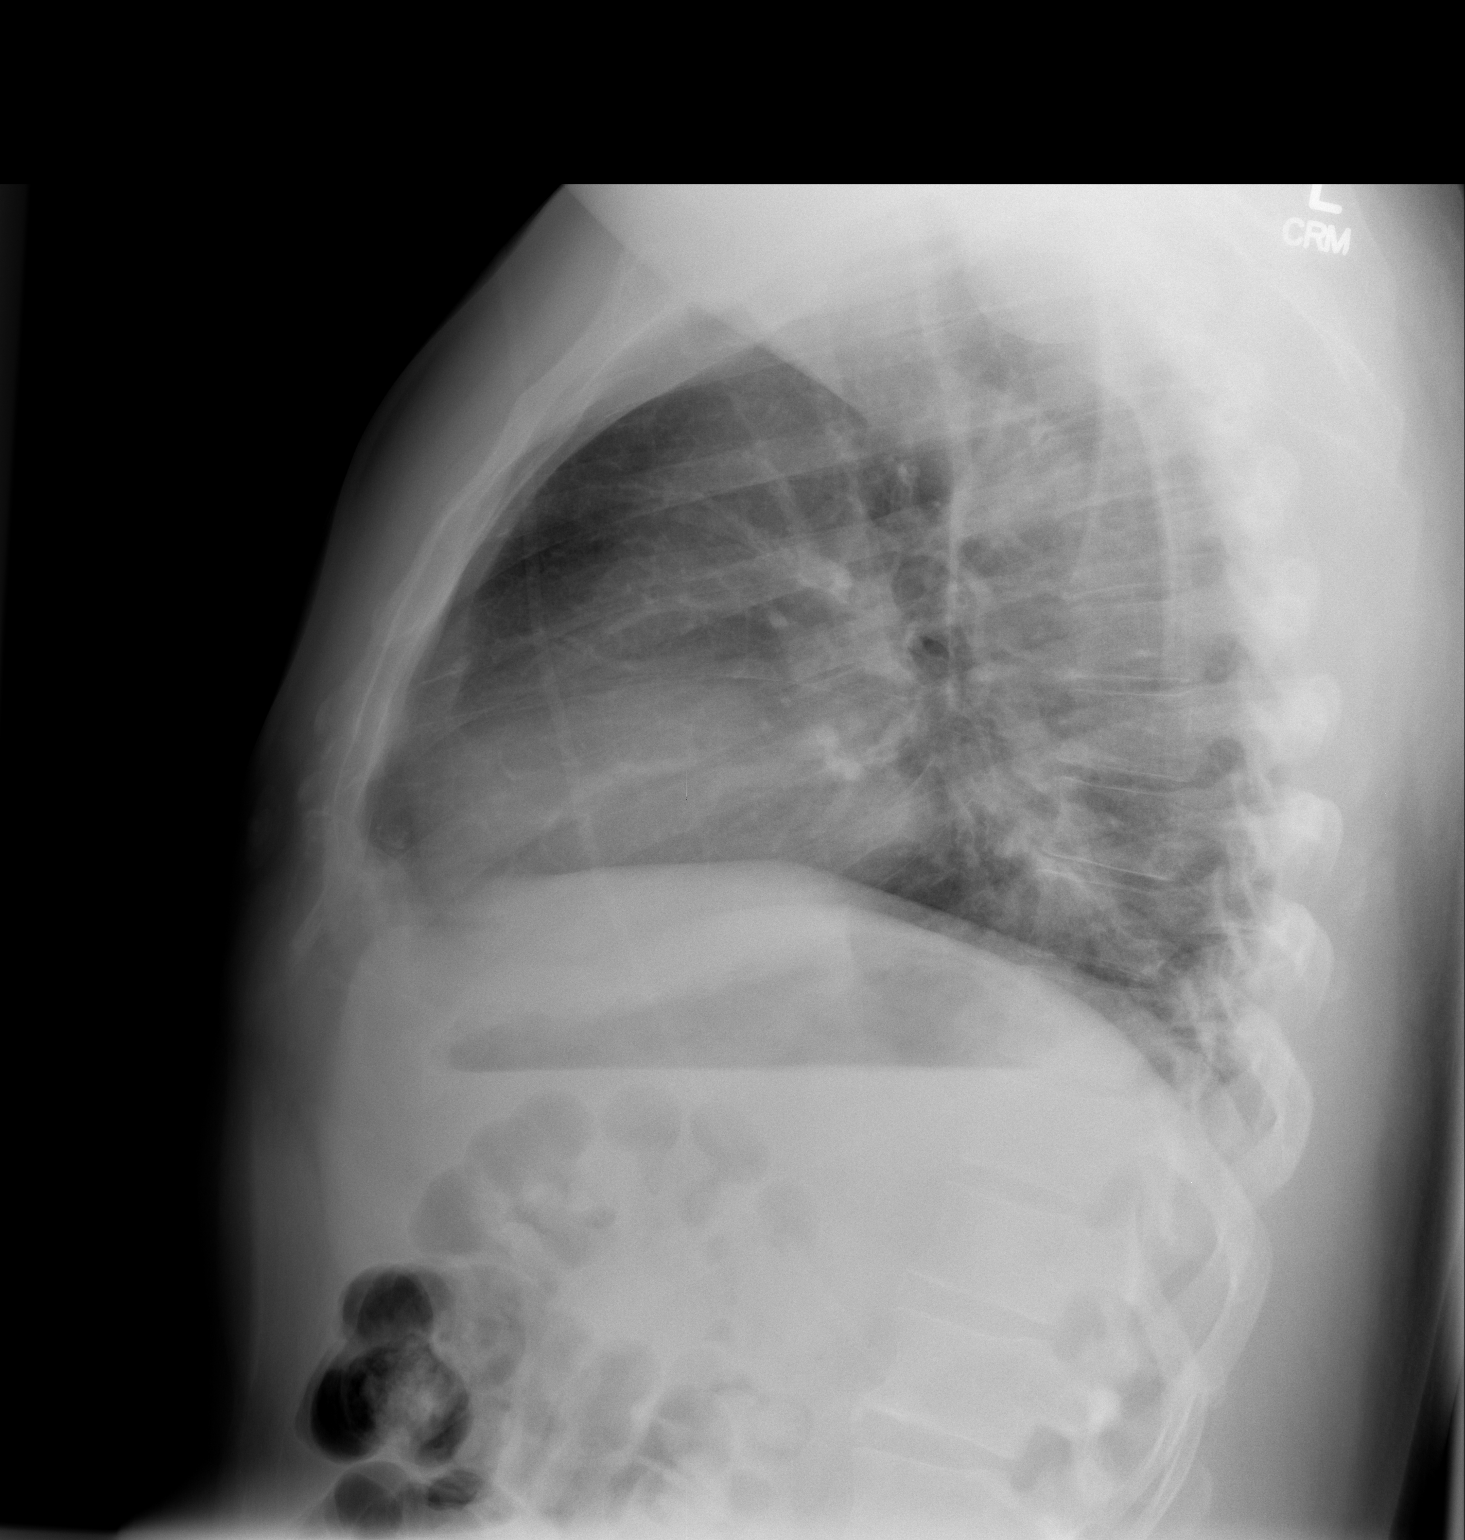

[2 of 2 positions shown; findings below may reference images not displayed]

FINDINGS: The lungs are well-aerated. Mild left basilar opacity likely
reflects atelectasis. There is no evidence of pleural effusion or
pneumothorax.

The heart is normal in size; the mediastinal contour is within
normal limits. No acute osseous abnormalities are seen.
IMPRESSION: Mild left basilar airspace opacity likely reflects atelectasis;
lungs otherwise clear.

## 2015-06-30 DIAGNOSIS — R030 Elevated blood-pressure reading, without diagnosis of hypertension: Secondary | ICD-10-CM | POA: Insufficient documentation

## 2015-06-30 DIAGNOSIS — G4733 Obstructive sleep apnea (adult) (pediatric): Secondary | ICD-10-CM | POA: Insufficient documentation

## 2015-07-12 DIAGNOSIS — K219 Gastro-esophageal reflux disease without esophagitis: Secondary | ICD-10-CM | POA: Insufficient documentation

## 2015-07-12 DIAGNOSIS — F419 Anxiety disorder, unspecified: Secondary | ICD-10-CM | POA: Insufficient documentation

## 2015-07-12 DIAGNOSIS — F341 Dysthymic disorder: Secondary | ICD-10-CM | POA: Insufficient documentation

## 2015-07-13 DIAGNOSIS — E6609 Other obesity due to excess calories: Secondary | ICD-10-CM | POA: Insufficient documentation

## 2015-07-13 DIAGNOSIS — F17200 Nicotine dependence, unspecified, uncomplicated: Secondary | ICD-10-CM | POA: Insufficient documentation

## 2015-08-04 ENCOUNTER — Other Ambulatory Visit: Payer: Self-pay | Admitting: Ophthalmology

## 2015-08-09 ENCOUNTER — Encounter (HOSPITAL_COMMUNITY): Payer: Self-pay | Admitting: *Deleted

## 2015-08-09 NOTE — Progress Notes (Signed)
Pt [facility] in April for chest pain. States he was cleared of any cardiac issues, but was put back on blood pressure medicine. Denies any chest pain since. Have requested EKG tracing and stress test from [facility].

## 2015-08-10 ENCOUNTER — Ambulatory Visit (HOSPITAL_COMMUNITY): Payer: Self-pay | Admitting: Anesthesiology

## 2015-08-10 ENCOUNTER — Encounter (HOSPITAL_COMMUNITY): Payer: Self-pay | Admitting: *Deleted

## 2015-08-10 ENCOUNTER — Encounter (HOSPITAL_COMMUNITY)
Admission: RE | Disposition: A | Discharge status: Home / Self Care | Payer: Self-pay | Source: Ambulatory Visit | Attending: Ophthalmology

## 2015-08-10 ENCOUNTER — Ambulatory Visit (HOSPITAL_COMMUNITY)
Admission: RE | Admit: 2015-08-10 | Discharge: 2015-08-10 | Disposition: A | Discharge status: Home / Self Care | Payer: Self-pay | Source: Ambulatory Visit | Attending: Ophthalmology | Admitting: Ophthalmology

## 2015-08-10 DIAGNOSIS — Z6834 Body mass index (BMI) 34.0-34.9, adult: Secondary | ICD-10-CM | POA: Insufficient documentation

## 2015-08-10 DIAGNOSIS — Z7982 Long term (current) use of aspirin: Secondary | ICD-10-CM | POA: Insufficient documentation

## 2015-08-10 DIAGNOSIS — I10 Essential (primary) hypertension: Secondary | ICD-10-CM | POA: Insufficient documentation

## 2015-08-10 DIAGNOSIS — F17210 Nicotine dependence, cigarettes, uncomplicated: Secondary | ICD-10-CM | POA: Insufficient documentation

## 2015-08-10 DIAGNOSIS — H3321 Serous retinal detachment, right eye: Secondary | ICD-10-CM | POA: Insufficient documentation

## 2015-08-10 DIAGNOSIS — Z8673 Personal history of transient ischemic attack (TIA), and cerebral infarction without residual deficits: Secondary | ICD-10-CM | POA: Insufficient documentation

## 2015-08-10 DIAGNOSIS — H33021 Retinal detachment with multiple breaks, right eye: Secondary | ICD-10-CM

## 2015-08-10 DIAGNOSIS — G4730 Sleep apnea, unspecified: Secondary | ICD-10-CM | POA: Insufficient documentation

## 2015-08-10 DIAGNOSIS — G43909 Migraine, unspecified, not intractable, without status migrainosus: Secondary | ICD-10-CM | POA: Insufficient documentation

## 2015-08-10 DIAGNOSIS — Z79899 Other long term (current) drug therapy: Secondary | ICD-10-CM | POA: Insufficient documentation

## 2015-08-10 HISTORY — DX: Other specified symptoms and signs involving the circulatory and respiratory systems: R09.89

## 2015-08-10 HISTORY — DX: Nausea with vomiting, unspecified: R11.2

## 2015-08-10 HISTORY — PX: SCLERAL BUCKLE: SHX5340

## 2015-08-10 HISTORY — DX: Headache: R51

## 2015-08-10 HISTORY — DX: Other specified postprocedural states: Z98.890

## 2015-08-10 HISTORY — DX: Serous retinal detachment, unspecified eye: H33.20

## 2015-08-10 HISTORY — DX: Headache, unspecified: R51.9

## 2015-08-10 HISTORY — DX: Cardiac arrhythmia, unspecified: I49.9

## 2015-08-10 HISTORY — DX: Gastro-esophageal reflux disease without esophagitis: K21.9

## 2015-08-10 HISTORY — DX: Cerebral infarction, unspecified: I63.9

## 2015-08-10 HISTORY — DX: Unspecified osteoarthritis, unspecified site: M19.90

## 2015-08-10 HISTORY — DX: Sleep apnea, unspecified: G47.30

## 2015-08-10 LAB — BASIC METABOLIC PANEL
Anion gap: 11 (ref 5–15)
BUN: 8 mg/dL (ref 6–20)
CALCIUM: 9.5 mg/dL (ref 8.9–10.3)
CO2: 24 mmol/L (ref 22–32)
CREATININE: 0.89 mg/dL (ref 0.61–1.24)
Chloride: 103 mmol/L (ref 101–111)
GFR calc Af Amer: >60 mL/min (ref >60)
GLUCOSE: 93 mg/dL (ref 65–99)
Potassium: 3.8 mmol/L (ref 3.5–5.1)
Sodium: 138 mmol/L (ref 135–145)

## 2015-08-10 LAB — CBC
HCT: 42.8 % (ref 39.0–52.0)
Hemoglobin: 14.7 g/dL (ref 13.0–17.0)
MCH: 28.7 pg (ref 26.0–34.0)
MCHC: 34.3 g/dL (ref 30.0–36.0)
MCV: 83.6 fL (ref 78.0–100.0)
PLATELETS: 298 10*3/uL (ref 150–400)
RBC: 5.12 MIL/uL (ref 4.22–5.81)
RDW: 12.9 % (ref 11.5–15.5)
WBC: 9.7 10*3/uL (ref 4.0–10.5)

## 2015-08-10 SURGERY — SCLERAL BUCKLE
Anesthesia: General | Site: Eye | Laterality: Right

## 2015-08-10 MED ORDER — GLYCOPYRROLATE 0.2 MG/ML IJ SOLN
INTRAMUSCULAR | Status: DC | PRN
  Administered 2015-08-10: 0.2 mg via INTRAVENOUS

## 2015-08-10 MED ORDER — BSS PLUS IO SOLN
INTRAOCULAR | Status: DC | PRN
  Administered 2015-08-10: 1 via INTRAOCULAR

## 2015-08-10 MED ORDER — BSS IO SOLN
INTRAOCULAR | Status: DC | PRN
  Administered 2015-08-10: 15 mL

## 2015-08-10 MED ORDER — MIDAZOLAM HCL 2 MG/2ML IJ SOLN
INTRAMUSCULAR | Status: AC
  Filled 2015-08-10: qty 2

## 2015-08-10 MED ORDER — TETRACAINE HCL 0.5 % OP SOLN
OPHTHALMIC | Status: AC
  Filled 2015-08-10: qty 2

## 2015-08-10 MED ORDER — CEFAZOLIN (ANCEF) 1 G IV SOLR
1.0000 g | INTRAVENOUS | Status: DC
  Filled 2015-08-10: qty 1

## 2015-08-10 MED ORDER — ROCURONIUM BROMIDE 100 MG/10ML IV SOLN
INTRAVENOUS | Status: DC | PRN
  Administered 2015-08-10: 50 mg via INTRAVENOUS
  Administered 2015-08-10 (×2): 10 mg via INTRAVENOUS
  Administered 2015-08-10: 20 mg via INTRAVENOUS

## 2015-08-10 MED ORDER — LIDOCAINE HCL (PF) 1 % IJ SOLN
INTRAMUSCULAR | Status: AC
  Filled 2015-08-10: qty 30

## 2015-08-10 MED ORDER — CEFAZOLIN SODIUM 1 G IJ SOLR
INTRAMUSCULAR | Status: DC | PRN
  Administered 2015-08-10: 1 g

## 2015-08-10 MED ORDER — OXYCODONE-ACETAMINOPHEN 5-325 MG PO TABS: 1.0000 | ORAL_TABLET | ORAL | Status: AC | PRN

## 2015-08-10 MED ORDER — EPINEPHRINE HCL 1 MG/ML IJ SOLN
INTRAMUSCULAR | Status: DC | PRN
  Administered 2015-08-10: .3 mL

## 2015-08-10 MED ORDER — PROPOFOL 10 MG/ML IV BOLUS
INTRAVENOUS | Status: AC
  Filled 2015-08-10: qty 20

## 2015-08-10 MED ORDER — EPHEDRINE SULFATE 50 MG/ML IJ SOLN
INTRAMUSCULAR | Status: DC | PRN
  Administered 2015-08-10 (×2): 5 mg via INTRAVENOUS

## 2015-08-10 MED ORDER — DEXAMETHASONE SODIUM PHOSPHATE 10 MG/ML IJ SOLN
INTRAMUSCULAR | Status: AC
  Filled 2015-08-10: qty 1

## 2015-08-10 MED ORDER — HYPROMELLOSE (GONIOSCOPIC) 2.5 % OP SOLN
OPHTHALMIC | Status: AC
  Filled 2015-08-10: qty 15

## 2015-08-10 MED ORDER — SUGAMMADEX SODIUM 200 MG/2ML IV SOLN
INTRAVENOUS | Status: DC | PRN
  Administered 2015-08-10: 200 mg via INTRAVENOUS

## 2015-08-10 MED ORDER — FENTANYL CITRATE (PF) 250 MCG/5ML IJ SOLN
INTRAMUSCULAR | Status: AC
  Filled 2015-08-10: qty 5

## 2015-08-10 MED ORDER — TOBRAMYCIN-DEXAMETHASONE 0.3-0.1 % OP OINT
TOPICAL_OINTMENT | OPHTHALMIC | Status: DC | PRN
  Administered 2015-08-10: 1 via OPHTHALMIC

## 2015-08-10 MED ORDER — MIDAZOLAM HCL 5 MG/5ML IJ SOLN
INTRAMUSCULAR | Status: DC | PRN
  Administered 2015-08-10: 2 mg via INTRAVENOUS

## 2015-08-10 MED ORDER — SCOPOLAMINE 1 MG/3DAYS TD PT72
MEDICATED_PATCH | TRANSDERMAL | Status: AC
  Administered 2015-08-10: 1 via TRANSDERMAL
  Filled 2015-08-10: qty 1

## 2015-08-10 MED ORDER — BSS PLUS IO SOLN
INTRAOCULAR | Status: AC
  Filled 2015-08-10: qty 500

## 2015-08-10 MED ORDER — ATROPINE SULFATE 1 % OP SOLN
1.0000 [drp] | OPHTHALMIC | Status: AC
Start: 2015-08-10 — End: 2015-08-10
  Administered 2015-08-10 (×3): 1 [drp] via OPHTHALMIC
  Filled 2015-08-10: qty 5

## 2015-08-10 MED ORDER — STERILE WATER FOR INJECTION IJ SOLN
INTRAMUSCULAR | Status: DC | PRN
  Administered 2015-08-10: 10 mL

## 2015-08-10 MED ORDER — BUPIVACAINE HCL (PF) 0.75 % IJ SOLN
INTRAMUSCULAR | Status: DC | PRN
  Administered 2015-08-10: 30 mL

## 2015-08-10 MED ORDER — DEXAMETHASONE SODIUM PHOSPHATE 10 MG/ML IJ SOLN
INTRAMUSCULAR | Status: DC | PRN
  Administered 2015-08-10: 10 mg via INTRAVENOUS

## 2015-08-10 MED ORDER — LIDOCAINE HCL (PF) 1 % IJ SOLN
INTRAMUSCULAR | Status: DC | PRN
  Administered 2015-08-10: 30 mL

## 2015-08-10 MED ORDER — SODIUM CHLORIDE 0.9 % IV SOLN
Freq: Once | INTRAVENOUS | Status: AC
  Administered 2015-08-10: 14:00:00 via INTRAVENOUS

## 2015-08-10 MED ORDER — FENTANYL CITRATE (PF) 100 MCG/2ML IJ SOLN
INTRAMUSCULAR | Status: DC | PRN
  Administered 2015-08-10 (×2): 100 ug via INTRAVENOUS
  Administered 2015-08-10: 50 ug via INTRAVENOUS

## 2015-08-10 MED ORDER — ONDANSETRON HCL 4 MG/2ML IJ SOLN
INTRAMUSCULAR | Status: AC
  Filled 2015-08-10: qty 2

## 2015-08-10 MED ORDER — HYPROMELLOSE (GONIOSCOPIC) 2.5 % OP SOLN
OPHTHALMIC | Status: DC | PRN
  Administered 2015-08-10: 2 [drp] via OPHTHALMIC

## 2015-08-10 MED ORDER — PHENYLEPHRINE HCL 2.5 % OP SOLN
1.0000 [drp] | OPHTHALMIC | Status: AC
Start: 2015-08-10 — End: 2015-08-10
  Administered 2015-08-10 (×3): 1 [drp] via OPHTHALMIC
  Filled 2015-08-10: qty 2

## 2015-08-10 MED ORDER — ONDANSETRON HCL 4 MG/2ML IJ SOLN
INTRAMUSCULAR | Status: DC | PRN
  Administered 2015-08-10: 4 mg via INTRAVENOUS

## 2015-08-10 MED ORDER — EPINEPHRINE HCL 1 MG/ML IJ SOLN
INTRAMUSCULAR | Status: AC
  Filled 2015-08-10: qty 1

## 2015-08-10 MED ORDER — EPHEDRINE 5 MG/ML INJ
INTRAVENOUS | Status: AC
  Filled 2015-08-10: qty 10

## 2015-08-10 MED ORDER — LIDOCAINE HCL (CARDIAC) 20 MG/ML IV SOLN
INTRAVENOUS | Status: DC | PRN
  Administered 2015-08-10: 60 mg via INTRAVENOUS

## 2015-08-10 MED ORDER — STERILE WATER FOR INJECTION IJ SOLN
INTRAMUSCULAR | Status: AC
  Filled 2015-08-10: qty 10

## 2015-08-10 MED ORDER — BSS IO SOLN
INTRAOCULAR | Status: AC
  Filled 2015-08-10: qty 15

## 2015-08-10 MED ORDER — HYALURONIDASE HUMAN 150 UNIT/ML IJ SOLN
INTRAMUSCULAR | Status: DC | PRN
  Administered 2015-08-10: 150 [IU]

## 2015-08-10 MED ORDER — TOBRAMYCIN-DEXAMETHASONE 0.3-0.1 % OP OINT
TOPICAL_OINTMENT | OPHTHALMIC | Status: AC
  Filled 2015-08-10: qty 3.5

## 2015-08-10 MED ORDER — LACTATED RINGERS IV SOLN
INTRAVENOUS | Status: DC | PRN
  Administered 2015-08-10: 17:00:00 via INTRAVENOUS

## 2015-08-10 MED ORDER — DEXAMETHASONE SODIUM PHOSPHATE 10 MG/ML IJ SOLN
INTRAMUSCULAR | Status: DC | PRN
  Administered 2015-08-10: 10 mg

## 2015-08-10 MED ORDER — ROCURONIUM BROMIDE 50 MG/5ML IV SOLN
INTRAVENOUS | Status: AC
  Filled 2015-08-10: qty 1

## 2015-08-10 MED ORDER — PROPOFOL 10 MG/ML IV BOLUS
INTRAVENOUS | Status: DC | PRN
  Administered 2015-08-10: 200 mg via INTRAVENOUS
  Administered 2015-08-10: 50 mg via INTRAVENOUS

## 2015-08-10 MED ORDER — SUGAMMADEX SODIUM 200 MG/2ML IV SOLN
INTRAVENOUS | Status: AC
  Filled 2015-08-10: qty 2

## 2015-08-10 MED ORDER — HYALURONIDASE HUMAN 150 UNIT/ML IJ SOLN
INTRAMUSCULAR | Status: AC
  Filled 2015-08-10: qty 1

## 2015-08-10 MED ORDER — BUPIVACAINE HCL (PF) 0.75 % IJ SOLN
INTRAMUSCULAR | Status: AC
  Filled 2015-08-10: qty 10

## 2015-08-10 MED ORDER — SODIUM CHLORIDE 0.9 % IV SOLN
INTRAVENOUS | Status: DC | PRN
  Administered 2015-08-10: 15:00:00 via INTRAVENOUS

## 2015-08-10 SURGICAL SUPPLY — 58 items
APL SRG 3 HI ABS STRL LF PLS (MISCELLANEOUS) ×1
APPLICATOR COTTON TIP 6IN STRL (MISCELLANEOUS) ×2 IMPLANT
APPLICATOR DR MATTHEWS STRL (MISCELLANEOUS) ×2 IMPLANT
BAND SCLERAL BUCKLING TYPE 42 (Ophthalmic Related) ×1 IMPLANT
BANDAGE EYE OVAL (MISCELLANEOUS) IMPLANT
BLADE MINI 60D BLUE (BLADE) ×1 IMPLANT
BLADE MINI RND TIP GREEN BEAV (BLADE) IMPLANT
CANNULA ANT CHAM MAIN (OPHTHALMIC RELATED) IMPLANT
CAUTERY EYE LOW TEMP 1300F FIN (OPHTHALMIC RELATED) ×2 IMPLANT
CORDS BIPOLAR (ELECTRODE) IMPLANT
COVER SURGICAL LIGHT HANDLE (MISCELLANEOUS) ×2 IMPLANT
FILTER BLUE MILLIPORE (MISCELLANEOUS) IMPLANT
FILTER STRAW FLUID ASPIR (MISCELLANEOUS) IMPLANT
GAS OPHTHALMIC (MISCELLANEOUS) IMPLANT
GLOVE BIO SURGEON STRL SZ7 (GLOVE) ×1 IMPLANT
GLOVE BIO SURGEON STRL SZ7.5 (GLOVE) ×2 IMPLANT
GLOVE BIOGEL PI IND STRL 7.0 (GLOVE) IMPLANT
GLOVE BIOGEL PI INDICATOR 7.0 (GLOVE) ×2
GLOVE SURG SS PI 7.0 STRL IVOR (GLOVE) ×2 IMPLANT
GOWN STRL REUS W/ TWL LRG LVL3 (GOWN DISPOSABLE) ×2 IMPLANT
GOWN STRL REUS W/TWL LRG LVL3 (GOWN DISPOSABLE) ×8
KIT BASIN OR (CUSTOM PROCEDURE TRAY) ×2 IMPLANT
KIT PERFLUORON PROCEDURE 5ML (MISCELLANEOUS) IMPLANT
KIT ROOM TURNOVER OR (KITS) ×2 IMPLANT
NDL 18GX1X1/2 (RX/OR ONLY) (NEEDLE) ×1 IMPLANT
NDL HYPO 18GX1.5 BLUNT FILL (NEEDLE) IMPLANT
NDL HYPO 25GX1X1/2 BEV (NEEDLE) IMPLANT
NDL HYPO 30X.5 LL (NEEDLE) ×2 IMPLANT
NDL RETROBULBAR 25GX1.5 (NEEDLE) IMPLANT
NEEDLE 18GX1X1/2 (RX/OR ONLY) (NEEDLE) ×2 IMPLANT
NEEDLE HYPO 18GX1.5 BLUNT FILL (NEEDLE) ×2 IMPLANT
NEEDLE HYPO 25GX1X1/2 BEV (NEEDLE) IMPLANT
NEEDLE HYPO 30X.5 LL (NEEDLE) ×4 IMPLANT
NEEDLE RETROBULBAR 25GX1.5 (NEEDLE) ×2 IMPLANT
NS IRRIG 1000ML POUR BTL (IV SOLUTION) ×2 IMPLANT
PACK VITRECTOMY CUSTOM (CUSTOM PROCEDURE TRAY) ×2 IMPLANT
PAD ARMBOARD 7.5X6 YLW CONV (MISCELLANEOUS) ×4 IMPLANT
PAK VITRECTOMY PIK 25 GA (OPHTHALMIC RELATED) IMPLANT
PROBE LASER ILLUM FLEX CVD 23G (OPHTHALMIC) IMPLANT
REPL STRA BRUSH NDL (NEEDLE) IMPLANT
REPL STRA BRUSH NEEDLE (NEEDLE) IMPLANT
RESERVOIR BACK FLUSH (MISCELLANEOUS) IMPLANT
RETRACTOR IRIS FLEX 25G GRIESH (INSTRUMENTS) IMPLANT
ROLLS DENTAL (MISCELLANEOUS) IMPLANT
SET FLUID INJECTOR (SET/KITS/TRAYS/PACK) IMPLANT
SHEET MEDIUM DRAPE 40X70 STRL (DRAPES) ×2 IMPLANT
SLEEVE SCLERAL BUCK TYPE 70 (Ophthalmic Related) ×1 IMPLANT
STOCKINETTE IMPERVIOUS 9X36 MD (GAUZE/BANDAGES/DRESSINGS) ×4 IMPLANT
STOPCOCK 4 WAY LG BORE MALE ST (IV SETS) IMPLANT
SUT ETHILON 5.0 S-24 (SUTURE) ×1 IMPLANT
SUT SILK 2 0 (SUTURE)
SUT SILK 2-0 18XBRD TIE 12 (SUTURE) ×1 IMPLANT
SUT VICRYL 7 0 TG140 8 (SUTURE) IMPLANT
SYR 20CC LL (SYRINGE) ×1 IMPLANT
SYRINGE 10CC LL (SYRINGE) ×1 IMPLANT
TOWEL OR 17X24 6PK STRL BLUE (TOWEL DISPOSABLE) ×4 IMPLANT
WATER STERILE IRR 1000ML POUR (IV SOLUTION) ×2 IMPLANT
WIPE INSTRUMENT VISIWIPE 73X73 (MISCELLANEOUS) IMPLANT

## 2015-08-10 NOTE — Transfer of Care (Signed)
Immediate Anesthesia Transfer of Care Note  Patient: Gary ReeksRobert J Mccorkle  Procedure(s) Performed: Procedure(s): SCLERAL BUCKLE (Right)  Patient Location: PACU  Anesthesia Type:General  Level of Consciousness: awake  Airway & Oxygen Therapy: Patient Spontanous Breathing and Patient connected to nasal cannula oxygen  Post-op Assessment: Report given to RN and Post -op Vital signs reviewed and stable  Post vital signs: stable  Last Vitals:  Filed Vitals:   08/10/15 1400  BP: 149/92  Pulse: 61  Temp: 37 C  Resp: 20    Last Pain: There were no vitals filed for this visit.    Patients Stated Pain Goal: 3 (08/10/15 1353)  Complications: No apparent anesthesia complications

## 2015-08-10 NOTE — Anesthesia Procedure Notes (Signed)
Procedure Name: Intubation Date/Time: 08/10/2015 4:35 PM Performed by: Renford DillsMULLINS, Jamone Garrido L Pre-anesthesia Checklist: Patient identified, Emergency Drugs available, Suction available and Patient being monitored Patient Re-evaluated:Patient Re-evaluated prior to inductionOxygen Delivery Method: Circle system utilized Preoxygenation: Pre-oxygenation with 100% oxygen Intubation Type: IV induction Ventilation: Oral airway inserted - appropriate to patient size and Two handed mask ventilation required Laryngoscope Size: Miller and 2 Grade View: Grade II Tube type: Oral Tube size: 8.0 mm Number of attempts: 1 Airway Equipment and Method: Stylet Placement Confirmation: ETT inserted through vocal cords under direct vision,  positive ETCO2 and breath sounds checked- equal and bilateral Secured at: 23 cm Tube secured with: Tape Dental Injury: Teeth and Oropharynx as per pre-operative assessment

## 2015-08-10 NOTE — H&P (Signed)
Gary Patel is an 40 y.o. male.   Chief Complaint: flashes of light in the right eye HPI: macula on RD OD  Past Medical History  Diagnosis Date  . Anxiety   . Hypertension   . GERD (gastroesophageal reflux disease)   . Sleep apnea     on cpap  . Irregular heart rate   . Stroke [facility])     pt states he was told this years ago.  Marland Kitchen Poor circulation of extremity ([place])   . Headache     migraine  . Arthritis   . Retinal detachment     right eye  . PONV (postoperative nausea and vomiting)     litte nausea    Past Surgical History  Procedure Laterality Date  . Carpal tunnel release Right   . Vasectomy    . Knee surgery Left   . Mouth surgery      Family History  Problem Relation Age of Onset  . Hypertension Mother   . Lupus Mother   . Diabetes Mother   . Thyroid disease Mother   . Anxiety disorder Mother   . Heart attack Father   . Hypertension Father   . CAD Father   . Diabetes Father    Social History:  reports that he has been smoking Cigarettes.  He has been smoking about 0.50 packs per day. He has never used smokeless tobacco. He reports that he does not drink alcohol or use illicit drugs.  Allergies:  Allergies  Allergen Reactions  . Strawberry Extract Anaphylaxis and Hives    Medications Prior to Admission  Medication Sig Dispense Refill  . acetaminophen (TYLENOL) 500 MG tablet Take 1,000 mg by mouth daily as needed for mild pain or headache.    Marland Kitchen aspirin EC 81 MG tablet Take 81 mg by mouth daily.    . Aspirin-Acetaminophen-Caffeine (GOODY HEADACHE PO) Take 1 packet by mouth daily as needed (for headaches).    Marland Kitchen guaiFENesin (MUCINEX) 600 MG 12 hr tablet Take 600 mg by mouth 2 (two) times daily as needed for to loosen phlegm.    Marland Kitchen ibuprofen (ADVIL,MOTRIN) 200 MG tablet Take 400 mg by mouth daily as needed for moderate pain.    Marland Kitchen loratadine (CLARITIN) 10 MG tablet Take 10 mg by mouth daily as needed for allergies.     . metoprolol (LOPRESSOR) 50 MG tablet  Take 50 mg by mouth 2 (two) times daily.    . naproxen sodium (ANAPROX) 220 MG tablet Take 440 mg by mouth daily as needed (for headache).      Results for orders placed or performed during the hospital encounter of 08/10/15 (from the past 48 hour(s))  Basic metabolic panel     Status: None   Collection Time: 08/10/15  1:50 PM  Result Value Ref Range   Sodium 138 135 - 145 mmol/L   Potassium 3.8 3.5 - 5.1 mmol/L   Chloride 103 101 - 111 mmol/L   CO2 24 22 - 32 mmol/L   Glucose, Bld 93 65 - 99 mg/dL   BUN 8 6 - 20 mg/dL   Creatinine, Ser 0.89 0.61 - 1.24 mg/dL   Calcium 9.5 8.9 - 10.3 mg/dL   GFR calc non Af Amer >60 >60 mL/min   GFR calc Af Amer >60 >60 mL/min    Comment: (NOTE) The eGFR has been calculated using the CKD EPI equation. This calculation has not been validated in all clinical situations. eGFR's persistently <60 mL/min signify possible Chronic  Kidney Disease.    Anion gap 11 5 - 15  CBC     Status: None   Collection Time: 08/10/15  1:50 PM  Result Value Ref Range   WBC 9.7 4.0 - 10.5 K/uL   RBC 5.12 4.22 - 5.81 MIL/uL   Hemoglobin 14.7 13.0 - 17.0 g/dL   HCT 42.8 39.0 - 52.0 %   MCV 83.6 78.0 - 100.0 fL   MCH 28.7 26.0 - 34.0 pg   MCHC 34.3 30.0 - 36.0 g/dL   RDW 12.9 11.5 - 15.5 %   Platelets 298 150 - 400 K/uL   No results found.  Review of Systems  Eyes: Positive for blurred vision.  All other systems reviewed and are negative.   Blood pressure 149/92, pulse 61, temperature 98.6 F (37 C), temperature source Oral, resp. rate 20, height 5' 10"  (1.778 m), weight 108.863 kg (240 lb), SpO2 98 %. Physical Exam  Constitutional: He appears well-developed and well-nourished.  Eyes:       Assessment/Plan 1. RD OD: SBP/drain OD.  Corliss Parish, MD 08/10/2015, 4:02 PM

## 2015-08-10 NOTE — Brief Op Note (Signed)
08/10/2015  6:50 PM  PATIENT:  Marveen Reeksobert J Tata  40 y.o. male  PRE-OPERATIVE DIAGNOSIS:  retinal detatchment  POST-OPERATIVE DIAGNOSIS:  retinal detatchment  PROCEDURE:  Procedure(s): SCLERAL BUCKLE (Right)  SURGEON:  Surgeon(s) and Role:    * Stephannie LiJason Arlis Everly, MD - Primary  PHYSICIAN ASSISTANT:   ASSISTANTS: none   ANESTHESIA:   general  EBL:  Total I/O In: 900 [I.V.:900] Out: 10 [Blood:10]  BLOOD ADMINISTERED:none  DRAINS: none   LOCAL MEDICATIONS USED:  BUPIVICAINE   SPECIMEN:  No Specimen  DISPOSITION OF SPECIMEN:  N/A  COUNTS:  YES  TOURNIQUET:  * No tourniquets in log *  DICTATION: .[id number]  PLAN OF CARE: Discharge to home after PACU  PATIENT DISPOSITION:  PACU - hemodynamically stable.   Delay start of Pharmacological VTE agent (>24hrs) due to surgical blood loss or risk of bleeding: not applicable

## 2015-08-10 NOTE — Anesthesia Preprocedure Evaluation (Addendum)
Anesthesia Evaluation  Patient identified by MRN, date of birth, ID band Patient awake    Reviewed: Allergy & Precautions, NPO status , Patient's Chart, lab work & pertinent test results, reviewed documented beta blocker date and time   Airway Mallampati: IV  TM Distance: >3 FB Neck ROM: Full    Dental  (+) Edentulous Upper, Edentulous Lower   Pulmonary sleep apnea and Continuous Positive Airway Pressure Ventilation , Current Smoker,    breath sounds clear to auscultation       Cardiovascular hypertension, Pt. on medications and Pt. on home beta blockers + Peripheral Vascular Disease   Rhythm:Regular     Neuro/Psych  Headaches, PSYCHIATRIC DISORDERS Anxiety CVA    GI/Hepatic GERD  Controlled,  Endo/Other  Morbid obesity  Renal/GU      Musculoskeletal  (+) Arthritis ,   Abdominal   Peds  Hematology negative hematology ROS (+)   Anesthesia Other Findings ED visit 06/2015 for HTN and chest pain, neg enzymes, nl ef and no wall motion abnormalities on TTE, neg [facility]  Reproductive/Obstetrics                            Anesthesia Physical Anesthesia Plan  ASA: III  Anesthesia Plan: General   Post-op Pain Management:    Induction: Intravenous  Airway Management Planned: Oral ETT  Additional Equipment: None  Intra-op Plan:   Post-operative Plan: Extubation in OR  Informed Consent: I have reviewed the patients History and Physical, chart, labs and discussed the procedure including the risks, benefits and alternatives for the proposed anesthesia with the patient or authorized representative who has indicated his/her understanding and acceptance.   Dental advisory given  Plan Discussed with: CRNA and Surgeon  Anesthesia Plan Comments:         Anesthesia Quick Evaluation

## 2015-08-10 NOTE — Progress Notes (Signed)
Dr. Allyne GeeSanders came by the bedside and reiterated post op instructions to patient; advised to keep head elevated even while laying down.  Gave post op care instruction sheet to patient in office, and discussed care instructions with patient's fiance.  Both have good understanding of post op instructions.  Dr. Allyne GeeSanders provided patient with his cell phone number for emergency or any other concerning issue.  Patient comfortable upon discharge; Rx for narcotic pain meds given to fiance.

## 2015-08-10 NOTE — Anesthesia Postprocedure Evaluation (Signed)
Anesthesia Post Note  Patient: Gary ReeksRobert J Coonradt  Procedure(s) Performed: Procedure(s) (LRB): SCLERAL BUCKLE (Right)  Patient location during evaluation: PACU Anesthesia Type: General Level of consciousness: awake and alert Pain management: pain level controlled Vital Signs Assessment: post-procedure vital signs reviewed and stable Respiratory status: spontaneous breathing, nonlabored ventilation, respiratory function stable and patient connected to nasal cannula oxygen Cardiovascular status: blood pressure returned to baseline and stable Postop Assessment: no signs of nausea or vomiting Anesthetic complications: no    Last Vitals:  Filed Vitals:   08/10/15 1400 08/10/15 1854  BP: 149/92 117/61  Pulse: 61 102  Temp: 37 C 36.8 C  Resp: 20 12    Last Pain:  Filed Vitals:   08/10/15 1859  PainSc: 3                  Sebastian Acheheodore Lynnleigh Soden

## 2015-08-11 ENCOUNTER — Encounter (HOSPITAL_COMMUNITY): Payer: Self-pay | Admitting: Ophthalmology

## 2015-08-11 NOTE — Op Note (Signed)
NAME:  Gary Patel, Gary Patel NO.:  [id number]  MEDICAL RECORD NO.:  [id number]  LOCATION:                                 FACILITY:  PHYSICIAN:  Donnel Saxon, MD    DATE OF BIRTH:  15-Sep-1975  DATE OF PROCEDURE:  08/10/2015 DATE OF DISCHARGE:                              OPERATIVE REPORT   SURGEON:  Donnel Saxon, MD.  ANESTHESIA:  General.  PREOPERATIVE DIAGNOSIS:  Retinal detachment in the right eye.  POSTOPERATIVE DIAGNOSIS:  Retinal detachment in the right eye.  PROCEDURE:  Scleral buckle for the right eye.  COMPLICATIONS:  None.  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  DESCRIPTION OF PROCEDURE:  The patient was identified in the preoperative holding area.  He was then taken to the operating room, where he was sedated and placed under general anesthesia.  At that point in time, the right orbit was anesthetized using a retrobulbar block consisting of a 1:1 mixture 0.75% bupivacaine and 1% lidocaine and 150 units of Halonix.  After the block was placed, the needle was removed. The right eye was then prepped and draped in usual sterile fashion for ocular surgery.  Once the eye was prepped and draped, a Lieberman speculum was placed between the right upper and lower eyelid for exposure.  A 360 degree conjunctival peritomy was performed with 0.3 forceps and Westcott scissors to prepare the eye per standard scleral buckle.  Stevens Tenotomy scissors were then used to dissect Tenon's capsule off the sclera in all 4 quadrants.  The 4 recti muscles were isolated and looped using 2-0 silk sutures.  Following this, the eye was inspected with scleral depression and indirect ophthalmoscopy.  The location of retinal breaks were marked on the external sclera using a marking pen and an Engineer, drilling.  Next, confluent cryotherapy was placed along all retinal breaks.  Following this, the eye was prepared for scleral  buckle by placing 5-0 nylon sutures in a horizontal mattress fashion in each of the 4 oblique quadrants.  Once this was completed, the eye was encircled with a #42 scleral buckle with the ends of the buckle brought in the inferior nasal quadrant and joined with a #70 sleeve.  Once the buckle was placed, a drain was performed of the subretinal fluid by passing a 25-gauge needle through the sclera into the subretinal space using the indirect ophthalmoscopy for observation. A complete drain of the subretinal fluid was performed.  The fluid was found to be extraordinarily viscous suggesting a chronic retinal detachment.  Once the fluid was drained, the needle was removed.  No retinal incarceration was involved.  The buckle height was adjusted. Excess buckle material was trimmed and discarded.  The 5-0 nylon sutures were then tied with the knots rotated posteriorly and then trimmed and discarded.  Once the buckle was secured, excess buckle material was removed.  Buckle height was inspected and found to be appropriately supporting the retinal pathology.  Next, 2-0 silk sutures were removed from the muscle insertions.  Tenon's capsule and conjunctiva were then reapproximated to the limbus using 7-0 Vicryl suture in an interrupted fashion.  Once this was completed, the eye was treated with 15 mg of Ancef to 1 mg of dexamethasone.  The eye was then treated topically with 1 drop of 1% atropine and TobraDex ointment.  The speculum was removed.  Eyelids were cleaned and closed.  It was then patched and shielded.  The patient was then taken to recovery in excellent condition having tolerated the procedure very well.     Donnel SaxonJason B Haniel Fix, MD   ______________________________ Donnel SaxonJason B Cardarius Senat, MD    JBS/MEDQ  D:  08/10/2015  T:  08/11/2015  Job:  [id number]

## 2017-03-03 DIAGNOSIS — E785 Hyperlipidemia, unspecified: Secondary | ICD-10-CM | POA: Insufficient documentation

## 2017-04-11 DIAGNOSIS — L723 Sebaceous cyst: Secondary | ICD-10-CM | POA: Insufficient documentation

## 2018-02-17 DIAGNOSIS — E669 Obesity, unspecified: Secondary | ICD-10-CM | POA: Insufficient documentation

## 2018-03-05 DIAGNOSIS — E119 Type 2 diabetes mellitus without complications: Secondary | ICD-10-CM | POA: Insufficient documentation

## 2020-12-18 DIAGNOSIS — M4807 Spinal stenosis, lumbosacral region: Secondary | ICD-10-CM | POA: Insufficient documentation

## 2021-01-30 DIAGNOSIS — M48062 Spinal stenosis, lumbar region with neurogenic claudication: Secondary | ICD-10-CM | POA: Insufficient documentation

## 2021-04-13 ENCOUNTER — Encounter: Payer: Self-pay | Admitting: Podiatry

## 2021-04-13 ENCOUNTER — Ambulatory Visit (INDEPENDENT_AMBULATORY_CARE_PROVIDER_SITE_OTHER): Payer: Managed Care, Other (non HMO) | Admitting: Podiatry

## 2021-04-13 ENCOUNTER — Other Ambulatory Visit: Payer: Self-pay

## 2021-04-13 DIAGNOSIS — L603 Nail dystrophy: Secondary | ICD-10-CM

## 2021-04-13 DIAGNOSIS — L600 Ingrowing nail: Secondary | ICD-10-CM

## 2021-04-13 NOTE — Progress Notes (Signed)
°  Subjective:  Patient ID: Gary Patel, male    DOB: [birth year],   MRN: [id number]  Chief Complaint  Patient presents with   Nail Problem    Bilateral thick painful toenails    46 y.o. male presents for concern of left great toenail pain and some pain in the right great toenail and right fifth nail. Relates years ago he did injure his toes. Relates the toenails have started to grow in down after that. Hoping to have them removed.   He is diabetic and last A1c was 6.9. Denies any other pedal complaints. Denies n/v/f/c.   PCP: Clyde Lundborg PA-C  Past Medical History:  Diagnosis Date   Anxiety    Arthritis    GERD (gastroesophageal reflux disease)    Headache    migraine   Hypertension    Irregular heart rate    PONV (postoperative nausea and vomiting)    litte nausea   Poor circulation of extremity    Retinal detachment    right eye   Sleep apnea    on cpap   Stroke [facility])    pt states he was told this years ago.    Objective:  Physical Exam: Vascular: DP/PT pulses 2/4 bilateral. CFT <3 seconds. Normal hair growth on digits. No edema.  Skin. No lacerations or abrasions bilateral feet. Hallux nails bilateral and right fifth digit are thickened elongated and incurvated at borders. No erythema or edema noted.   Musculoskeletal: MMT 5/5 bilateral lower extremities in DF, PF, Inversion and Eversion. Deceased ROM in DF of ankle joint.  Neurological: Sensation intact to light touch.   Assessment:   1. Onychodystrophy   2. Ingrown nail of great toe of right foot   3. Ingrown nail of great toe of left foot      Plan:  Patient was evaluated and treated and all questions answered. Patient requesting removal of ingrown dystrophic nail today. Procedure below.  Discussed procedure and post procedure care and patient expressed understanding.  Will follow-up in 2 weeks for nail check or sooner if any problems arise.    Procedure:  Procedure: total Nail Avulsion of  bilaterally hallux .  Surgeon: Lorenda Peck, DPM  Pre-op Dx: Ingrown toenail without infection Post-op: Same  Place of Surgery: Office exam room.  Indications for surgery: Painful and ingrown toenail.    The patient is requesting removal of nail with chemical matrixectomy. Risks and complications were discussed with the patient for which they understand and written consent was obtained. Under sterile conditions a total of 3 mL of  1% lidocaine plain was infiltrated in a hallux block fashion. Once anesthetized, the skin was prepped in sterile fashion. A tourniquet was then applied. Next the hallux nails were removed bilateral  Next phenol was then applied under standard conditions and copiously irrigated. Silvadene was applied. A dry sterile dressing was applied. After application of the dressing the tourniquet was removed and there is found to be an immediate capillary refill time to the digit. The patient tolerated the procedure well without any complications. Post procedure instructions were discussed the patient for which he verbally understood. Follow-up in two weeks for nail check or sooner if any problems are to arise. Discussed signs/symptoms of infection and directed to call the office immediately should any occur or go directly to the emergency room. In the meantime, encouraged to call the office with any questions, concerns, changes symptoms.    Lorenda Peck, DPM

## 2021-04-13 NOTE — Patient Instructions (Signed)

## 2021-04-27 ENCOUNTER — Ambulatory Visit (INDEPENDENT_AMBULATORY_CARE_PROVIDER_SITE_OTHER): Payer: Self-pay | Admitting: Podiatry

## 2021-04-27 ENCOUNTER — Other Ambulatory Visit: Payer: Self-pay

## 2021-04-27 ENCOUNTER — Encounter: Payer: Self-pay | Admitting: Podiatry

## 2021-04-27 DIAGNOSIS — L600 Ingrowing nail: Secondary | ICD-10-CM

## 2021-04-27 DIAGNOSIS — L603 Nail dystrophy: Secondary | ICD-10-CM

## 2021-04-27 NOTE — Progress Notes (Signed)
°  Subjective:  Patient ID: Gary Patel, male    DOB: 18-Jul-1975,   MRN: [id number]  Chief Complaint  Patient presents with   Follow-up    2 week nail check    46 y.o. male presents for follow-up of bilateral total nail avulsion. Relates he has continued soaking and doing neosporin and bandaid. Has been keeping open to air at night. Some tenderness still to the toes but better . Denies any other pedal complaints. Denies n/v/f/c.   Past Medical History:  Diagnosis Date   Anxiety    Arthritis    GERD (gastroesophageal reflux disease)    Headache    migraine   Hypertension    Irregular heart rate    PONV (postoperative nausea and vomiting)    litte nausea   Poor circulation of extremity    Retinal detachment    right eye   Sleep apnea    on cpap   Stroke [facility])    pt states he was told this years ago.    Objective:  Physical Exam: Vascular: DP/PT pulses 2/4 bilateral. CFT <3 seconds. Normal hair growth on digits. No edema.  Skin. No lacerations or abrasions bilateral feet. Hallux nail beds bilateral healing well. No erythema edema or purulence noted. Mild maceration noted surrounding.  Musculoskeletal: MMT 5/5 bilateral lower extremities in DF, PF, Inversion and Eversion. Deceased ROM in DF of ankle joint.  Neurological: Sensation intact to light touch.   Assessment:   1. Ingrown nail of great toe of left foot   2. Ingrown nail of great toe of right foot   3. Onychodystrophy      Plan:  Patient was evaluated and treated and all questions answered. Toe was evaluated and appears to be healing well.  Continue soaks every other day and leave nail open to air.  Patient to follow-up as needed.    Louann Sjogren, DPM
